# Patient Record
Sex: Female | Born: 1935 | Race: White | Hispanic: No | Marital: Married | Smoking: Never smoker
Health system: Southern US, Community
[De-identification: ages and names within clinical notes are randomized; demographics above are authoritative.]

## PROBLEM LIST (undated history)

## (undated) DIAGNOSIS — I739 Peripheral vascular disease, unspecified: Secondary | ICD-10-CM

## (undated) DIAGNOSIS — I639 Cerebral infarction, unspecified: Secondary | ICD-10-CM

## (undated) DIAGNOSIS — I10 Essential (primary) hypertension: Secondary | ICD-10-CM

## (undated) DIAGNOSIS — C4490 Unspecified malignant neoplasm of skin, unspecified: Secondary | ICD-10-CM

## (undated) DIAGNOSIS — R55 Syncope and collapse: Secondary | ICD-10-CM

## (undated) DIAGNOSIS — E079 Disorder of thyroid, unspecified: Secondary | ICD-10-CM

## (undated) DIAGNOSIS — F0390 Unspecified dementia without behavioral disturbance: Secondary | ICD-10-CM

## (undated) DIAGNOSIS — F419 Anxiety disorder, unspecified: Secondary | ICD-10-CM

## (undated) DIAGNOSIS — R569 Unspecified convulsions: Secondary | ICD-10-CM

## (undated) DIAGNOSIS — M1990 Unspecified osteoarthritis, unspecified site: Secondary | ICD-10-CM

## (undated) DIAGNOSIS — K449 Diaphragmatic hernia without obstruction or gangrene: Secondary | ICD-10-CM

---

## 2004-05-30 ENCOUNTER — Ambulatory Visit: Payer: Self-pay | Admitting: Family Medicine

## 2006-07-21 ENCOUNTER — Ambulatory Visit: Payer: Self-pay | Admitting: Family Medicine

## 2009-01-11 ENCOUNTER — Ambulatory Visit (HOSPITAL_COMMUNITY): Admission: RE | Admit: 2009-01-11 | Discharge: 2009-01-11 | Payer: Self-pay | Admitting: Ophthalmology

## 2009-02-01 ENCOUNTER — Ambulatory Visit (HOSPITAL_COMMUNITY): Admission: RE | Admit: 2009-02-01 | Discharge: 2009-02-01 | Payer: Self-pay | Admitting: Ophthalmology

## 2009-02-20 ENCOUNTER — Ambulatory Visit (HOSPITAL_COMMUNITY): Admission: RE | Admit: 2009-02-20 | Discharge: 2009-02-20 | Payer: Self-pay | Admitting: Family Medicine

## 2010-01-21 ENCOUNTER — Emergency Department (HOSPITAL_COMMUNITY): Admission: EM | Admit: 2010-01-21 | Discharge: 2010-01-21 | Payer: Self-pay | Admitting: Emergency Medicine

## 2010-08-09 LAB — BASIC METABOLIC PANEL
BUN: 11 mg/dL (ref 6–23)
CO2: 28 mEq/L (ref 19–32)
Calcium: 9.7 mg/dL (ref 8.4–10.5)
Creatinine, Ser: 0.8 mg/dL (ref 0.4–1.2)
GFR calc Af Amer: >60 mL/min (ref >60)
Potassium: 4.2 mEq/L (ref 3.5–5.1)

## 2010-08-09 LAB — HEMOGLOBIN AND HEMATOCRIT, BLOOD: Hemoglobin: 10.8 g/dL — ABNORMAL LOW (ref 12.0–15.0)

## 2014-03-29 ENCOUNTER — Other Ambulatory Visit (HOSPITAL_COMMUNITY): Payer: Self-pay | Admitting: Family Medicine

## 2014-03-29 DIAGNOSIS — R140 Abdominal distension (gaseous): Secondary | ICD-10-CM

## 2014-04-03 ENCOUNTER — Ambulatory Visit (HOSPITAL_COMMUNITY)
Admission: RE | Admit: 2014-04-03 | Discharge: 2014-04-03 | Disposition: A | Discharge status: Home / Self Care | Payer: Medicare Other | Source: Ambulatory Visit | Attending: Family Medicine | Admitting: Family Medicine

## 2014-04-03 DIAGNOSIS — R140 Abdominal distension (gaseous): Secondary | ICD-10-CM | POA: Diagnosis not present

## 2014-06-01 ENCOUNTER — Emergency Department (HOSPITAL_COMMUNITY): Payer: Medicare Other

## 2014-06-01 ENCOUNTER — Observation Stay (HOSPITAL_COMMUNITY): Payer: Medicare Other

## 2014-06-01 ENCOUNTER — Observation Stay (HOSPITAL_COMMUNITY)
Admission: EM | Admit: 2014-06-01 | Discharge: 2014-06-03 | Disposition: A | Discharge status: Home / Self Care | Payer: Medicare Other | Attending: Internal Medicine | Admitting: Internal Medicine

## 2014-06-01 ENCOUNTER — Encounter (HOSPITAL_COMMUNITY): Payer: Self-pay

## 2014-06-01 DIAGNOSIS — E039 Hypothyroidism, unspecified: Secondary | ICD-10-CM | POA: Diagnosis present

## 2014-06-01 DIAGNOSIS — I739 Peripheral vascular disease, unspecified: Secondary | ICD-10-CM | POA: Insufficient documentation

## 2014-06-01 DIAGNOSIS — R7989 Other specified abnormal findings of blood chemistry: Secondary | ICD-10-CM | POA: Insufficient documentation

## 2014-06-01 DIAGNOSIS — Z79899 Other long term (current) drug therapy: Secondary | ICD-10-CM | POA: Diagnosis not present

## 2014-06-01 DIAGNOSIS — F0390 Unspecified dementia without behavioral disturbance: Secondary | ICD-10-CM | POA: Diagnosis not present

## 2014-06-01 DIAGNOSIS — E079 Disorder of thyroid, unspecified: Secondary | ICD-10-CM | POA: Diagnosis not present

## 2014-06-01 DIAGNOSIS — R778 Other specified abnormalities of plasma proteins: Secondary | ICD-10-CM

## 2014-06-01 DIAGNOSIS — E785 Hyperlipidemia, unspecified: Secondary | ICD-10-CM | POA: Diagnosis present

## 2014-06-01 DIAGNOSIS — R140 Abdominal distension (gaseous): Secondary | ICD-10-CM

## 2014-06-01 DIAGNOSIS — R197 Diarrhea, unspecified: Secondary | ICD-10-CM | POA: Diagnosis not present

## 2014-06-01 DIAGNOSIS — R55 Syncope and collapse: Secondary | ICD-10-CM | POA: Diagnosis not present

## 2014-06-01 DIAGNOSIS — F419 Anxiety disorder, unspecified: Secondary | ICD-10-CM | POA: Insufficient documentation

## 2014-06-01 DIAGNOSIS — M1990 Unspecified osteoarthritis, unspecified site: Secondary | ICD-10-CM | POA: Diagnosis not present

## 2014-06-01 DIAGNOSIS — I10 Essential (primary) hypertension: Secondary | ICD-10-CM | POA: Diagnosis present

## 2014-06-01 DIAGNOSIS — F0391 Unspecified dementia with behavioral disturbance: Secondary | ICD-10-CM

## 2014-06-01 DIAGNOSIS — I639 Cerebral infarction, unspecified: Secondary | ICD-10-CM

## 2014-06-01 DIAGNOSIS — Z8673 Personal history of transient ischemic attack (TIA), and cerebral infarction without residual deficits: Secondary | ICD-10-CM | POA: Diagnosis present

## 2014-06-01 HISTORY — DX: Anxiety disorder, unspecified: F41.9

## 2014-06-01 HISTORY — DX: Peripheral vascular disease, unspecified: I73.9

## 2014-06-01 HISTORY — DX: Unspecified osteoarthritis, unspecified site: M19.90

## 2014-06-01 HISTORY — DX: Disorder of thyroid, unspecified: E07.9

## 2014-06-01 HISTORY — DX: Unspecified dementia, unspecified severity, without behavioral disturbance, psychotic disturbance, mood disturbance, and anxiety: F03.90

## 2014-06-01 LAB — COMPREHENSIVE METABOLIC PANEL
ALT: 13 U/L (ref 0–35)
AST: 23 U/L (ref 0–37)
Albumin: 3.6 g/dL (ref 3.5–5.2)
Alkaline Phosphatase: 64 U/L (ref 39–117)
Anion gap: 6 (ref 5–15)
BILIRUBIN TOTAL: 0.4 mg/dL (ref 0.3–1.2)
BUN: 14 mg/dL (ref 6–23)
CALCIUM: 9 mg/dL (ref 8.4–10.5)
CHLORIDE: 97 mmol/L (ref 96–112)
CO2: 31 mmol/L (ref 19–32)
CREATININE: 0.86 mg/dL (ref 0.50–1.10)
GFR calc non Af Amer: 60 mL/min — ABNORMAL LOW (ref >90)
GFR, EST AFRICAN AMERICAN: 70 mL/min — AB (ref >90)
Glucose, Bld: 121 mg/dL — ABNORMAL HIGH (ref 70–99)
Potassium: 3.4 mmol/L — ABNORMAL LOW (ref 3.5–5.1)
SODIUM: 134 mmol/L — AB (ref 135–145)
TOTAL PROTEIN: 7.5 g/dL (ref 6.0–8.3)

## 2014-06-01 LAB — CBC WITH DIFFERENTIAL/PLATELET
BASOS ABS: 0 10*3/uL (ref 0.0–0.1)
BASOS PCT: 0 % (ref 0–1)
Eosinophils Absolute: 0.3 10*3/uL (ref 0.0–0.7)
Eosinophils Relative: 2 % (ref 0–5)
HEMATOCRIT: 34.2 % — AB (ref 36.0–46.0)
Hemoglobin: 11.3 g/dL — ABNORMAL LOW (ref 12.0–15.0)
Lymphocytes Relative: 16 % (ref 12–46)
Lymphs Abs: 1.8 10*3/uL (ref 0.7–4.0)
MCH: 30.8 pg (ref 26.0–34.0)
MCHC: 33 g/dL (ref 30.0–36.0)
MCV: 93.2 fL (ref 78.0–100.0)
MONO ABS: 1.2 10*3/uL — AB (ref 0.1–1.0)
MONOS PCT: 10 % (ref 3–12)
Neutro Abs: 8.2 10*3/uL — ABNORMAL HIGH (ref 1.7–7.7)
Neutrophils Relative %: 72 % (ref 43–77)
PLATELETS: 237 10*3/uL (ref 150–400)
RBC: 3.67 MIL/uL — ABNORMAL LOW (ref 3.87–5.11)
RDW: 14 % (ref 11.5–15.5)
WBC: 11.5 10*3/uL — ABNORMAL HIGH (ref 4.0–10.5)

## 2014-06-01 LAB — URINALYSIS, ROUTINE W REFLEX MICROSCOPIC
BILIRUBIN URINE: NEGATIVE
GLUCOSE, UA: NEGATIVE mg/dL
Hgb urine dipstick: NEGATIVE
Ketones, ur: NEGATIVE mg/dL
LEUKOCYTES UA: NEGATIVE
Nitrite: NEGATIVE
PROTEIN: NEGATIVE mg/dL
SPECIFIC GRAVITY, URINE: 1.01 (ref 1.005–1.030)
UROBILINOGEN UA: 0.2 mg/dL (ref 0.0–1.0)
pH: 6 (ref 5.0–8.0)

## 2014-06-01 LAB — TROPONIN I
Troponin I: 0.04 ng/mL — ABNORMAL HIGH (ref <0.031)
Troponin I: 0.03 ng/mL (ref <0.031)
Troponin I: 0.04 ng/mL — ABNORMAL HIGH (ref <0.031)

## 2014-06-01 LAB — LIPASE, BLOOD: Lipase: 35 U/L (ref 11–59)

## 2014-06-01 MED ORDER — MEMANTINE HCL 10 MG PO TABS
10.0000 mg | ORAL_TABLET | Freq: Two times a day (BID) | ORAL | Status: DC
  Administered 2014-06-01 – 2014-06-03 (×4): 10 mg via ORAL
  Filled 2014-06-01 (×4): qty 1

## 2014-06-01 MED ORDER — SODIUM CHLORIDE 0.9 % IJ SOLN
3.0000 mL | Freq: Two times a day (BID) | INTRAMUSCULAR | Status: DC
  Administered 2014-06-02: 3 mL via INTRAVENOUS

## 2014-06-01 MED ORDER — PRAVASTATIN SODIUM 40 MG PO TABS
40.0000 mg | ORAL_TABLET | Freq: Every day | ORAL | Status: DC
  Administered 2014-06-01 – 2014-06-02 (×2): 40 mg via ORAL
  Filled 2014-06-01 (×2): qty 1

## 2014-06-01 MED ORDER — LEVOTHYROXINE SODIUM 50 MCG PO TABS: 50.0000 ug | ORAL_TABLET | Freq: Every day | ORAL | Status: DC

## 2014-06-01 MED ORDER — ASPIRIN 81 MG PO CHEW
81.0000 mg | CHEWABLE_TABLET | Freq: Every day | ORAL | Status: DC
  Administered 2014-06-02: 81 mg via ORAL
  Filled 2014-06-01: qty 1

## 2014-06-01 MED ORDER — SODIUM CHLORIDE 0.9 % IV SOLN
INTRAVENOUS | Status: DC
  Administered 2014-06-01 – 2014-06-02 (×2): via INTRAVENOUS

## 2014-06-01 MED ORDER — LORAZEPAM 0.5 MG PO TABS
0.5000 mg | ORAL_TABLET | Freq: Two times a day (BID) | ORAL | Status: DC
  Administered 2014-06-01 – 2014-06-03 (×4): 0.5 mg via ORAL
  Filled 2014-06-01 (×4): qty 1

## 2014-06-01 MED ORDER — LEVOTHYROXINE SODIUM 50 MCG PO TABS
50.0000 ug | ORAL_TABLET | Freq: Every day | ORAL | Status: DC
  Administered 2014-06-02 – 2014-06-03 (×2): 50 ug via ORAL
  Filled 2014-06-01 (×2): qty 1

## 2014-06-01 MED ORDER — ONDANSETRON HCL 4 MG/2ML IJ SOLN: 4.0000 mg | Freq: Four times a day (QID) | INTRAMUSCULAR | Status: DC | PRN

## 2014-06-01 MED ORDER — VITAMIN B-12 1000 MCG PO TABS
1000.0000 ug | ORAL_TABLET | Freq: Every day | ORAL | Status: DC
  Administered 2014-06-02 – 2014-06-03 (×2): 1000 ug via ORAL
  Filled 2014-06-01 (×2): qty 1

## 2014-06-01 MED ORDER — POTASSIUM CHLORIDE CRYS ER 20 MEQ PO TBCR
20.0000 meq | EXTENDED_RELEASE_TABLET | Freq: Every day | ORAL | Status: DC
  Administered 2014-06-01 – 2014-06-02 (×2): 20 meq via ORAL
  Filled 2014-06-01 (×2): qty 1

## 2014-06-01 MED ORDER — MELATONIN 10 MG PO TABS: 10.0000 mg | ORAL_TABLET | Freq: Every day | ORAL | Status: DC

## 2014-06-01 MED ORDER — LEVOTHYROXINE SODIUM 50 MCG PO TABS: 50000.0000 ug | ORAL_TABLET | Freq: Every day | ORAL | Status: DC

## 2014-06-01 MED ORDER — SIMETHICONE 80 MG PO CHEW: 80.0000 mg | CHEWABLE_TABLET | Freq: Four times a day (QID) | ORAL | Status: DC | PRN

## 2014-06-01 MED ORDER — ONDANSETRON HCL 4 MG PO TABS: 4.0000 mg | ORAL_TABLET | Freq: Four times a day (QID) | ORAL | Status: DC | PRN

## 2014-06-01 MED ORDER — DONEPEZIL HCL 5 MG PO TABS
5.0000 mg | ORAL_TABLET | Freq: Every day | ORAL | Status: DC
  Administered 2014-06-01 – 2014-06-02 (×2): 5 mg via ORAL
  Filled 2014-06-01 (×2): qty 1

## 2014-06-01 MED ORDER — HYDROCHLOROTHIAZIDE 25 MG PO TABS
25.0000 mg | ORAL_TABLET | Freq: Every day | ORAL | Status: DC
  Administered 2014-06-02: 25 mg via ORAL
  Filled 2014-06-01: qty 1

## 2014-06-01 MED ORDER — HEPARIN SODIUM (PORCINE) 5000 UNIT/ML IJ SOLN
5000.0000 [IU] | Freq: Three times a day (TID) | INTRAMUSCULAR | Status: DC
  Administered 2014-06-01 – 2014-06-03 (×6): 5000 [IU] via SUBCUTANEOUS
  Filled 2014-06-01 (×6): qty 1

## 2014-06-01 NOTE — ED Notes (Signed)
Pt c/o nausea, luq pain, lower back pain, diarrhea, and generalized weakness since this morning.  EMS reports cbg was 122.

## 2014-06-01 NOTE — ED Provider Notes (Signed)
CSN: [id number]     Arrival date & time 06/01/14  1258 History   First MD Initiated Contact with Patient 06/01/14 1319     Chief Complaint  Patient presents with  . Loss of Consciousness  . Diarrhea      Patient is a 81 y.o. female presenting with syncope and diarrhea. The history is provided by a caregiver, a relative and the patient. The history is limited by the condition of the patient (Hx dementia).  Loss of Consciousness Diarrhea Pt was seen at 1330.  Per EMS, pt's family and pt: Family states pt has had several "loose stools" since yesterday. Went into bathroom today and "was taking a while," so family went to check on her. Pt was found "unresponsive," "slumped over" and "drooling." Family states pt was "passed out" for several minutes and was more confused than her baseline when she spontaneously awoke. No reported seizure activity, no incont of bowel/bladder, no black or blood in stools, no N/V, no fevers. Pt herself has significant hx of dementia and currently denies any complaints. Family is also concerned regarding pt's "left hand grip and arm strength is weaker than her right since Thanksgiving." Family has not taken pt to her PMD for evaluation of this complaint.     Past Medical History  Diagnosis Date  . Thyroid disease   . Dementia   . Anxiety    History reviewed. No pertinent past surgical history.  History  Substance Use Topics  . Smoking status: Never Smoker   . Smokeless tobacco: Not on file  . Alcohol Use: No    Review of Systems  Unable to perform ROS: Dementia  Cardiovascular: Positive for syncope.  Gastrointestinal: Positive for diarrhea.      Allergies  Review of patient's allergies indicates no known allergies.  Home Medications   Prior to Admission medications   Not on File   BP 147/59 mmHg  Pulse 66  Temp(Src) 97.7 F (36.5 C) (Oral)  Resp 16  Ht 5\' 4"  (1.626 m)  Wt 128 lb (58.06 kg)  BMI 21.96 kg/m2  SpO2 98% Physical  Exam 1335: Physical examination:  Nursing notes reviewed; Vital signs and O2 SAT reviewed;  Constitutional: Well developed, Well nourished, In no acute distress; Head:  Normocephalic, atraumatic; Eyes: EOMI, PERRL, No scleral icterus; ENMT: Mouth and pharynx normal, Mucous membranes dry; Neck: Supple, Full range of motion, No lymphadenopathy; Cardiovascular: Regular rate and rhythm, No gallop; Respiratory: Breath sounds clear & equal bilaterally, No wheezes.  Speaking full sentences with ease, Normal respiratory effort/excursion; Chest: Nontender, Movement normal; Abdomen: Soft, Nontender, Nondistended, Normal bowel sounds; Genitourinary: No CVA tenderness; Extremities: Pulses normal, No tenderness, No edema, No calf edema or asymmetry.; Neuro: Awake, alert, confused re: events per hx dementia. Major CN grossly intact. No facial droop. Speech clear. Grips equal. Strength 5/5 equal bilat UE's and LE's. Moves all extremities on stretcher spontaneously and to command without apparent gross focal motor deficits.; Skin: Color normal, Warm, Dry.   ED Course  Procedures     EKG Interpretation None      MDM  MDM Reviewed: previous chart, nursing note and vitals Reviewed previous: labs Interpretation: labs, ECG, x-ray and CT scan     Date: 06/01/2014  Rate: 60  Rhythm: normal sinus rhythm  QRS Axis: normal  Intervals: PR prolonged  ST/T Wave abnormalities: nonspecific ST/T changes, ST depression inferior and lateral leads, no ST elevations  Conduction Disutrbances:first-degree A-V block   Narrative Interpretation:   Old EKG  Reviewed: none available.  Results for orders placed or performed during the hospital encounter of 06/01/14  CBC with Differential  Result Value Ref Range   WBC 11.5 (H) 4.0 - 10.5 K/uL   RBC 3.67 (L) 3.87 - 5.11 MIL/uL   Hemoglobin 11.3 (L) 12.0 - 15.0 g/dL   HCT 34.2 (L) 36.0 - 46.0 %   MCV 93.2 78.0 - 100.0 fL   MCH 30.8 26.0 - 34.0 pg   MCHC 33.0 30.0 - 36.0  g/dL   RDW 14.0 11.5 - 15.5 %   Platelets 237 150 - 400 K/uL   Neutrophils Relative % 72 43 - 77 %   Neutro Abs 8.2 (H) 1.7 - 7.7 K/uL   Lymphocytes Relative 16 12 - 46 %   Lymphs Abs 1.8 0.7 - 4.0 K/uL   Monocytes Relative 10 3 - 12 %   Monocytes Absolute 1.2 (H) 0.1 - 1.0 K/uL   Eosinophils Relative 2 0 - 5 %   Eosinophils Absolute 0.3 0.0 - 0.7 K/uL   Basophils Relative 0 0 - 1 %   Basophils Absolute 0.0 0.0 - 0.1 K/uL  Comprehensive metabolic panel  Result Value Ref Range   Sodium 134 (L) 135 - 145 mmol/L   Potassium 3.4 (L) 3.5 - 5.1 mmol/L   Chloride 97 96 - 112 mmol/L   CO2 31 19 - 32 mmol/L   Glucose, Bld 121 (H) 70 - 99 mg/dL   BUN 14 6 - 23 mg/dL   Creatinine, Ser 0.86 0.50 - 1.10 mg/dL   Calcium 9.0 8.4 - 10.5 mg/dL   Total Protein 7.5 6.0 - 8.3 g/dL   Albumin 3.6 3.5 - 5.2 g/dL   AST 23 0 - 37 U/L   ALT 13 0 - 35 U/L   Alkaline Phosphatase 64 39 - 117 U/L   Total Bilirubin 0.4 0.3 - 1.2 mg/dL   GFR calc non Af Amer 60 (L) >90 mL/min   GFR calc Af Amer 70 (L) >90 mL/min   Anion gap 6 5 - 15  Lipase, blood  Result Value Ref Range   Lipase 35 11 - 59 U/L  Troponin I  Result Value Ref Range   Troponin I 0.04 (H) <0.031 ng/mL   Dg Chest 2 View 06/01/2014   CLINICAL DATA:  Left upper quadrant abdominal pain.  Low back pain.  EXAM: CHEST  2 VIEW  COMPARISON:  Single view of the chest 03/28/2011.  FINDINGS: Mild left basilar atelectasis is noted. The lungs are otherwise clear. Heart size is upper normal. The patient has a small hiatal hernia. No pneumothorax or pleural effusion. Thoracolumbar scoliosis is noted.  IMPRESSION: No acute disease.  Small hiatal hernia.   Electronically Signed   By: Inge Rise M.D.   On: 06/01/2014 14:38   Ct Head Wo Contrast 06/01/2014   CLINICAL DATA:  81 year old female with syncope and generalized weakness  EXAM: CT HEAD WITHOUT CONTRAST  TECHNIQUE: Contiguous axial images were obtained from the base of the skull through the vertex  without intravenous contrast.  COMPARISON:  None.  FINDINGS: Negative for acute intracranial hemorrhage, acute infarction, mass, mass effect, hydrocephalus or midline shift. Gray-white differentiation is preserved throughout. Mild ventriculomegaly likely secondary to central atrophy. Focal hypoattenuation in the right caudate head and PET min consistent with remote lacunar infarcts. Visualize globes and orbits are symmetric bilaterally. No focal soft tissue or calvarial abnormality. Incidental note is made of bilateral concha bullosa. Normal aeration of the mastoid air  cells and visualized paranasal sinuses. Atherosclerotic calcifications present in the bilateral cavernous carotid arteries.  IMPRESSION: 1. No acute intracranial abnormality. 2. Ventriculomegaly is likely secondary to central atrophy. However, in the appropriate clinical setting normal pressure hydrocephalus would be difficult to exclude radiographically. 3. Remote lacunar infarcts in the right basal ganglia. 4. Atherosclerotic calcifications in the bilateral cavernous carotid arteries.   Electronically Signed   By: Jacqulynn Cadet M.D.   On: 06/01/2014 15:49    1525:  Troponin mildly elevated, but EKG without ST elevations and pt denies CP. Pt is not orthostatic. Dx and testing d/w pt and family.  Questions answered.  Verb understanding, agreeable to admit.   T/C to [facility]. Anastasio Champion, case discussed, including:  HPI, pertinent PM/SHx, VS/PE, dx testing, ED course and treatment:  Agreeable to admit, requests to write temporary orders, obtain tele bed to team APAdmits.    Francine Graven, DO 06/05/14 1233

## 2014-06-01 NOTE — ED Notes (Signed)
Daughters in room.  And states pt had episode earlier today while sitting on toilet where she was unresponsive.  States she has had several of these episodes in the past.  And has been admitted to [facility] hospital with clean workup.  Also states pt has had some diarrhea and abdominal pain since yesterday.  Pt points to LUQ when asked where she hurts.

## 2014-06-01 NOTE — H&P (Signed)
[facility] History and Physical  Patricia Shepherd TIR:[id number] DOB: 1928-02-26 DOA: 06/01/2014  Referring physician: ER PCP: Patricia Mustache, MD   Chief Complaint: Syncope  HPI: Patricia Shepherd is a 81 y.o. female  This is an 81 year old lady who has mild-to-moderate dementia and who had an episode of unresponsiveness today whilst she was in the bathroom. She apparently was unresponsive for several minutes, maybe as long as 10 minutes. No seizure activity was witnessed. She appears now to be back to her baseline. On closer questioning the patient's daughters who are at the bedside, it appears that this is the fourth episode like this that she has had in the last 2 years. The family is also concerned about possible weakness in the left hand and arm since Thanksgiving of last year. The patient also has had some loose stools in the last 24 hours. She denies any chest pain, palpitations, dyspnea, cough or fever. History is somewhat limited secondary to her dementia.   Review of Systems:  Apart from symptoms above, all other systems negative.  Past Medical History  Diagnosis Date  . Thyroid disease   . Dementia   . Anxiety    History reviewed. No pertinent past surgical history. Social History:  reports that she has never smoked. She does not have any smokeless tobacco history on file. She reports that she does not drink alcohol or use illicit drugs.  No Known Allergies  Family history: there does appear to be a history of cerebrovascular disease in the family with several extended family members having had a stroke or TIA.  Prior to Admission medications   Medication Sig Start Date End Date Taking? Authorizing Provider  acetaminophen (TYLENOL) 500 MG tablet Take 500-1,000 mg by mouth at bedtime. Takes 2 tablets in the morning, 1 tablet at lunch and 2 tablets at bedtime   Yes Historical Provider, MD  aspirin 81 MG chewable tablet Chew 81 mg by mouth daily.   Yes Historical  Provider, MD  cetirizine (ZYRTEC) 10 MG tablet Take 5 mg by mouth at bedtime.   Yes Historical Provider, MD  donepezil (ARICEPT) 10 MG tablet Take 5 mg by mouth at bedtime. 04/10/14  Yes Historical Provider, MD  hydrochlorothiazide (HYDRODIURIL) 25 MG tablet Take 25 mg by mouth daily. 11/15/13 11/15/14 Yes Historical Provider, MD  LORazepam (ATIVAN) 0.5 MG tablet 0.5 mg 2 (two) times daily.  05/30/14  Yes Historical Provider, MD  Melatonin 10 MG TABS Take 10 mg by mouth at bedtime.   Yes Historical Provider, MD  memantine (NAMENDA) 10 MG tablet Take 10 mg by mouth 2 (two) times daily. 01/23/14  Yes Historical Provider, MD  potassium chloride (K-DUR,KLOR-CON) 10 MEQ tablet Take 20 mEq by mouth at bedtime. Dissolve in water then mix in applesauce 04/11/14  Yes Historical Provider, MD  pravastatin (PRAVACHOL) 40 MG tablet Take 40 mg by mouth at bedtime. 05/06/14  Yes Historical Provider, MD  simethicone (MYLICON) 676 MG chewable tablet Chew 125 mg by mouth at bedtime.   Yes Historical Provider, MD  SYNTHROID 50 MCG tablet Take 50 mg by mouth daily. 05/23/14  Yes Historical Provider, MD  vitamin B-12 (CYANOCOBALAMIN) 1000 MCG tablet Take 1,000 mcg by mouth daily.   Yes Historical Provider, MD   Physical Exam: Filed Vitals:   06/01/14 1415 06/01/14 1430 06/01/14 1500 06/01/14 1600  BP:  126/53 127/54 151/64  Pulse: 66 64 65 65  Temp:      TempSrc:      Resp:  15 16 16 19   Height:      Weight:      SpO2: 97% 94% 93% 97%    Wt Readings from Last 3 Encounters:  06/01/14 58.06 kg (128 lb)    General:  Appears calm and comfortable. She is not clinically dehydrated. She appears to be hemodynamically stable. Eyes: PERRL, normal lids, irises & conjunctiva ENT: grossly normal hearing, lips & tongue Neck: no LAD, masses or thyromegaly Cardiovascular: RRR, no m/r/g. No LE edema. Telemetry: SR, no arrhythmias  Respiratory: CTA bilaterally, no w/r/r. Normal respiratory effort. Abdomen: soft, ntnd Skin: no  rash or induration seen on limited exam Musculoskeletal: grossly normal tone BUE/BLE Psychiatric: grossly normal mood and affect, speech fluent and appropriate Neurologic: grossly non-focal.          Labs on Admission:  Basic Metabolic Panel:  Recent Labs Lab 06/01/14 1316  NA 134*  K 3.4*  CL 97  CO2 31  GLUCOSE 121*  BUN 14  CREATININE 0.86  CALCIUM 9.0   Liver Function Tests:  Recent Labs Lab 06/01/14 1316  AST 23  ALT 13  ALKPHOS 64  BILITOT 0.4  PROT 7.5  ALBUMIN 3.6    Recent Labs Lab 06/01/14 1316  LIPASE 35   No results for input(s): AMMONIA in the last 168 hours. CBC:  Recent Labs Lab 06/01/14 1316  WBC 11.5*  NEUTROABS 8.2*  HGB 11.3*  HCT 34.2*  MCV 93.2  PLT 237   Cardiac Enzymes:  Recent Labs Lab 06/01/14 1316  TROPONINI 0.04*    BNP (last 3 results) No results for input(s): PROBNP in the last 8760 hours. CBG: No results for input(s): GLUCAP in the last 168 hours.  Radiological Exams on Admission: Dg Chest 2 View  06/01/2014   CLINICAL DATA:  Left upper quadrant abdominal pain.  Low back pain.  EXAM: CHEST  2 VIEW  COMPARISON:  Single view of the chest 03/28/2011.  FINDINGS: Mild left basilar atelectasis is noted. The lungs are otherwise clear. Heart size is upper normal. The patient has a small hiatal hernia. No pneumothorax or pleural effusion. Thoracolumbar scoliosis is noted.  IMPRESSION: No acute disease.  Small hiatal hernia.   Electronically Signed   By: Inge Rise M.D.   On: 06/01/2014 14:38   Ct Head Wo Contrast  06/01/2014   CLINICAL DATA:  81 year old female with syncope and generalized weakness  EXAM: CT HEAD WITHOUT CONTRAST  TECHNIQUE: Contiguous axial images were obtained from the base of the skull through the vertex without intravenous contrast.  COMPARISON:  None.  FINDINGS: Negative for acute intracranial hemorrhage, acute infarction, mass, mass effect, hydrocephalus or midline shift. Gray-white  differentiation is preserved throughout. Mild ventriculomegaly likely secondary to central atrophy. Focal hypoattenuation in the right caudate head and PET min consistent with remote lacunar infarcts. Visualize globes and orbits are symmetric bilaterally. No focal soft tissue or calvarial abnormality. Incidental note is made of bilateral concha bullosa. Normal aeration of the mastoid air cells and visualized paranasal sinuses. Atherosclerotic calcifications present in the bilateral cavernous carotid arteries.  IMPRESSION: 1. No acute intracranial abnormality. 2. Ventriculomegaly is likely secondary to central atrophy. However, in the appropriate clinical setting normal pressure hydrocephalus would be difficult to exclude radiographically. 3. Remote lacunar infarcts in the right basal ganglia. 4. Atherosclerotic calcifications in the bilateral cavernous carotid arteries.   Electronically Signed   By: Jacqulynn Cadet M.D.   On: 06/01/2014 15:49    Assessment/Plan   1. Syncope. I suspect  this is a vasovagal event. The other 3 episodes that occurred over the last 2 years have always been on the toilet. Her troponin is slightly elevated. The family are concerned about possible TIA or stroke. She will be admitted to telemetry. She will get serial cardiac enzymes. I will order MRI brain scan to see if she has had a cerebrovascular event.  Further recommendations will depend on patient's hospital progress.   Code Status: Full code. I did discuss CODE STATUS with both her daughters and they will discuss this further amongst themselves.   DVT Prophylaxis: Heparin.  Family Communication: I discussed the plan with the patient's daughters at the bedside.   Disposition Plan: Home when medically stable, probably tomorrow.   Time spent: 60 minutes.  Doree Albee [facility] Pager [phone].

## 2014-06-01 NOTE — Progress Notes (Signed)
PHARMACIST - PHYSICIAN ORDER COMMUNICATION  CONCERNING: P&T Medication Policy on Herbal Medications  DESCRIPTION:  This patient's order for:  Melatonin  has been noted.  This product(s) is classified as an "herbal" or natural product. Due to a lack of definitive safety studies or FDA approval, nonstandard manufacturing practices, plus the potential risk of unknown drug-drug interactions while on inpatient medications, the Pharmacy and Therapeutics Committee does not permit the use of "herbal" or natural products of this type within [facility].   ACTION TAKEN: The pharmacy department is unable to verify this order at this time and your patient has been informed of this safety policy. Please reevaluate patient's clinical condition at discharge and address if the herbal or natural product(s) should be resumed at that time.  Netta Cedars, PharmD, BCPS 06/01/2014@4 :22 PM

## 2014-06-01 NOTE — ED Notes (Signed)
Alecia Lemming EMT and J.J. Cruise R.N. Performed in/out cath

## 2014-06-02 ENCOUNTER — Observation Stay (HOSPITAL_COMMUNITY): Payer: Medicare Other

## 2014-06-02 DIAGNOSIS — Z8673 Personal history of transient ischemic attack (TIA), and cerebral infarction without residual deficits: Secondary | ICD-10-CM | POA: Diagnosis present

## 2014-06-02 DIAGNOSIS — I10 Essential (primary) hypertension: Secondary | ICD-10-CM

## 2014-06-02 DIAGNOSIS — I6789 Other cerebrovascular disease: Secondary | ICD-10-CM

## 2014-06-02 DIAGNOSIS — I639 Cerebral infarction, unspecified: Secondary | ICD-10-CM

## 2014-06-02 DIAGNOSIS — F0390 Unspecified dementia without behavioral disturbance: Secondary | ICD-10-CM

## 2014-06-02 DIAGNOSIS — R7989 Other specified abnormal findings of blood chemistry: Secondary | ICD-10-CM

## 2014-06-02 DIAGNOSIS — E039 Hypothyroidism, unspecified: Secondary | ICD-10-CM

## 2014-06-02 DIAGNOSIS — R778 Other specified abnormalities of plasma proteins: Secondary | ICD-10-CM | POA: Diagnosis present

## 2014-06-02 DIAGNOSIS — E785 Hyperlipidemia, unspecified: Secondary | ICD-10-CM | POA: Diagnosis present

## 2014-06-02 LAB — LIPID PANEL
CHOL/HDL RATIO: 5.5 ratio
Cholesterol: 182 mg/dL (ref 0–200)
HDL: 33 mg/dL — ABNORMAL LOW (ref >39)
LDL Cholesterol: 88 mg/dL (ref 0–99)
Triglycerides: 303 mg/dL — ABNORMAL HIGH (ref <150)
VLDL: 61 mg/dL — AB (ref 0–40)

## 2014-06-02 LAB — COMPREHENSIVE METABOLIC PANEL
ALT: 13 U/L (ref 0–35)
AST: 22 U/L (ref 0–37)
Albumin: 3.3 g/dL — ABNORMAL LOW (ref 3.5–5.2)
Alkaline Phosphatase: 64 U/L (ref 39–117)
Anion gap: 8 (ref 5–15)
BUN: 9 mg/dL (ref 6–23)
CHLORIDE: 100 mmol/L (ref 96–112)
CO2: 30 mmol/L (ref 19–32)
Calcium: 9.1 mg/dL (ref 8.4–10.5)
Creatinine, Ser: 0.69 mg/dL (ref 0.50–1.10)
GFR calc Af Amer: >90 mL/min (ref >90)
GFR, EST NON AFRICAN AMERICAN: 78 mL/min — AB (ref >90)
Glucose, Bld: 103 mg/dL — ABNORMAL HIGH (ref 70–99)
Potassium: 3.4 mmol/L — ABNORMAL LOW (ref 3.5–5.1)
SODIUM: 138 mmol/L (ref 135–145)
TOTAL PROTEIN: 7 g/dL (ref 6.0–8.3)
Total Bilirubin: 0.6 mg/dL (ref 0.3–1.2)

## 2014-06-02 LAB — CBC
HEMATOCRIT: 35.3 % — AB (ref 36.0–46.0)
Hemoglobin: 11.5 g/dL — ABNORMAL LOW (ref 12.0–15.0)
MCH: 30.3 pg (ref 26.0–34.0)
MCHC: 32.6 g/dL (ref 30.0–36.0)
MCV: 93.1 fL (ref 78.0–100.0)
Platelets: 245 10*3/uL (ref 150–400)
RBC: 3.79 MIL/uL — AB (ref 3.87–5.11)
RDW: 14.3 % (ref 11.5–15.5)
WBC: 9.1 10*3/uL (ref 4.0–10.5)

## 2014-06-02 LAB — TROPONIN I: Troponin I: 0.04 ng/mL — ABNORMAL HIGH (ref <0.031)

## 2014-06-02 LAB — TSH: TSH: 3.721 u[IU]/mL (ref 0.350–4.500)

## 2014-06-02 MED ORDER — POTASSIUM CHLORIDE CRYS ER 20 MEQ PO TBCR
40.0000 meq | EXTENDED_RELEASE_TABLET | Freq: Once | ORAL | Status: AC
  Administered 2014-06-02: 40 meq via ORAL
  Filled 2014-06-02: qty 2

## 2014-06-02 MED ORDER — ASPIRIN 325 MG PO TABS
325.0000 mg | ORAL_TABLET | Freq: Every day | ORAL | Status: DC
  Administered 2014-06-02 – 2014-06-03 (×2): 325 mg via ORAL
  Filled 2014-06-02 (×2): qty 1

## 2014-06-02 MED ORDER — ZOLPIDEM TARTRATE 5 MG PO TABS
2.5000 mg | ORAL_TABLET | Freq: Once | ORAL | Status: AC
  Administered 2014-06-02: 2.5 mg via ORAL
  Filled 2014-06-02: qty 1

## 2014-06-02 MED ORDER — ASPIRIN 325 MG PO TABS: 325.0000 mg | ORAL_TABLET | Freq: Every day | ORAL | Status: DC

## 2014-06-02 MED ORDER — STROKE: EARLY STAGES OF RECOVERY BOOK
Freq: Once | Status: AC
  Administered 2014-06-02: 10:00:00
  Filled 2014-06-02: qty 1

## 2014-06-02 NOTE — Discharge Summary (Signed)
Physician Discharge Summary  Patricia Shepherd JXB:[id number] DOB: August 30, 1927 DOA: 06/01/2014  PCP: Sherrie Mustache, MD  Admit date: 06/01/2014 Discharge date: 06/03/2014  Time spent: 40 minutes  Recommendations for Outpatient Follow-up:  1. PCP 1 week for evaluation of symptoms.  2. HCTZ held due to dehydration and syncope 3. Consider surveillance carotid dopplers for R ICA disease in 6-12 months   Discharge Diagnoses:  Principal Problem:   Subacute CVA  Active Problems:   Syncope, likely vasovagal:    Dementia:    Essential hypertension:   Hypothyroidism:   Dehydration   Discharge Condition: stable  Diet recommendation: heart healthy  Filed Weights   06/01/14 1303 06/01/14 1824  Weight: 58.06 kg (128 lb) 70.761 kg (156 lb)    History of present illness:  This is an 81 year old lady who has mild-to-moderate dementia and who had an episode of unresponsiveness on 06/01/14 whilst she was in the bathroom presented to ED with CC syncope. She apparently was unresponsive for several minutes, maybe as long as 10 minutes. No seizure activity was witnessed. She appeared  to be back to her baseline at the time of admission exam. On closer questioning the patient's daughters who were at the bedside, it appeared that this is the fourth episode like this that she has had in the last 2 years. The family was also concerned about possible weakness in the left hand and arm since Thanksgiving of last year. The patient also  had some loose stools in the last 24 hours. She denied any chest pain, palpitations, dyspnea, cough or fever. History is somewhat limited secondary to her dementia.  Hospital Course:  CVA (cerebral infarction)   MRI with Small subacute appearing lacunar infarct in the left cerebellum. No mass effect or hemorrhage. No other acute intracranial abnormality identified, and elsewhere mild for age chronic small vessel ischemia. A1c within limits of normal. Lipid panel with triglycerides  303 and HDL 33 and VLDL 61. Carotid doppler Moderate amount of right-sided atherosclerotic plaque results in elevated peak systolic velocities within the proximal and mid aspect of the right internal carotid artery compatible with the higher end of the 50-69% luminal narrowing range. Minimal amount of left-sided atherosclerotic plaque, not resulting in a hemodynamically significant stenosis. Physical therapy evaluated the patient and she needs no follow up PT. Patient was chronically on aspirin 81mg  daily. This was increased to 325mg  daily. Continue statin.     Syncope: likely vaso vagal in setting of above. Hx of similar episodes while going to bathroom. No events on tele. Not orthostatic. Echo unremarkable.   Elevated troponin: stable at 0.04. No events on tele. No chest pain. EKG non acute. No further work up.    Dementia: stable at baseline    Essential hypertension: currently stable. Continue to monitor blood pressure since HCTZ held due to dehydration     Hypothyroidism: TSH normal, continue synthroid   Procedures:  Echo: - Left ventricle: The cavity size was normal. Wall thickness was increased in a pattern of mild LVH. Systolic function was vigorous. The estimated ejection fraction was in the range of 65% to 70%. Wall motion was normal; there were no regional wall motion abnormalities. Features are consistent with a pseudonormal left ventricular filling pattern, with concomitant abnormal relaxation and increased filling pressure (grade 2 diastolic dysfunction). - Aortic valve: Mildly calcified annulus. Trileaflet; mildly thickened leaflets. There was moderate regurgitation. The AI vena contracta is 0.4 cm. Regurgitation pressure half-time: 365 ms. - Mitral valve: Moderately to severely calcified  annulus. Mildly thickened leaflets . There was mild regurgitation. - Left atrium: The atrium was moderately dilated. - Right atrium: The atrium was mildly  dilated. - Atrial septum: No defect or patent foramen ovale was identified. - Pulmonary arteries: PA peak pressure: 33 mm Hg (S). PASP is borderline elevated. - Systemic veins: IVC is small, suggesting low RA pressure and hypovolemia. - Technically adequate study.  Consultations:  none  Discharge Exam: Filed Vitals:   06/02/14 1331  BP: 161/71  Pulse: 78  Temp: 98.1 F (36.7 C)  Resp: 18    General: well nourished nad Cardiovascular: RRR no MGR No LE edema Respiratory: normal effort BS clear Neuro: oriented to self and place. Speech clear bilateral grip 5.5  Discharge Instructions    Current Discharge Medication List    START taking these medications   Details  aspirin 325 MG tablet Take 1 tablet (325 mg total) by mouth daily.      CONTINUE these medications which have NOT CHANGED   Details  acetaminophen (TYLENOL) 500 MG tablet Take 500-1,000 mg by mouth at bedtime. Takes 2 tablets in the morning, 1 tablet at lunch and 2 tablets at bedtime    cetirizine (ZYRTEC) 10 MG tablet Take 5 mg by mouth at bedtime.    donepezil (ARICEPT) 10 MG tablet Take 5 mg by mouth at bedtime.    LORazepam (ATIVAN) 0.5 MG tablet 0.5 mg 2 (two) times daily.  Refills: 2    Melatonin 10 MG TABS Take 10 mg by mouth at bedtime.    memantine (NAMENDA) 10 MG tablet Take 10 mg by mouth 2 (two) times daily.    potassium chloride (K-DUR,KLOR-CON) 10 MEQ tablet Take 20 mEq by mouth at bedtime. Dissolve in water then mix in applesauce    pravastatin (PRAVACHOL) 40 MG tablet Take 40 mg by mouth at bedtime. Refills: 4    simethicone (MYLICON) 665 MG chewable tablet Chew 125 mg by mouth at bedtime.    SYNTHROID 50 MCG tablet Take 50 mg by mouth daily. Refills: 5    vitamin B-12 (CYANOCOBALAMIN) 1000 MCG tablet Take 1,000 mcg by mouth daily.      STOP taking these medications     aspirin 81 MG chewable tablet      hydrochlorothiazide (HYDRODIURIL) 25 MG tablet        Allergies   Allergen Reactions  . Bee Venom Swelling   Follow-up Information    Follow up with Sherrie Mustache, MD. Schedule an appointment as soon as possible for a visit in 1 week.   Specialty:  Family Medicine   Why:  for evaluation of symptoms   Contact information:   [place] [id number] [phone]        The results of significant diagnostics from this hospitalization (including imaging, microbiology, ancillary and laboratory) are listed below for reference.    Significant Diagnostic Studies: Dg Chest 2 View  06/01/2014   CLINICAL DATA:  Left upper quadrant abdominal pain.  Low back pain.  EXAM: CHEST  2 VIEW  COMPARISON:  Single view of the chest 03/28/2011.  FINDINGS: Mild left basilar atelectasis is noted. The lungs are otherwise clear. Heart size is upper normal. The patient has a small hiatal hernia. No pneumothorax or pleural effusion. Thoracolumbar scoliosis is noted.  IMPRESSION: No acute disease.  Small hiatal hernia.   Electronically Signed   By: Inge Rise M.D.   On: 06/01/2014 14:38   Ct Head Wo Contrast  06/01/2014  CLINICAL DATA:  81 year old female with syncope and generalized weakness  EXAM: CT HEAD WITHOUT CONTRAST  TECHNIQUE: Contiguous axial images were obtained from the base of the skull through the vertex without intravenous contrast.  COMPARISON:  None.  FINDINGS: Negative for acute intracranial hemorrhage, acute infarction, mass, mass effect, hydrocephalus or midline shift. Gray-white differentiation is preserved throughout. Mild ventriculomegaly likely secondary to central atrophy. Focal hypoattenuation in the right caudate head and PET min consistent with remote lacunar infarcts. Visualize globes and orbits are symmetric bilaterally. No focal soft tissue or calvarial abnormality. Incidental note is made of bilateral concha bullosa. Normal aeration of the mastoid air cells and visualized paranasal sinuses. Atherosclerotic calcifications present  in the bilateral cavernous carotid arteries.  IMPRESSION: 1. No acute intracranial abnormality. 2. Ventriculomegaly is likely secondary to central atrophy. However, in the appropriate clinical setting normal pressure hydrocephalus would be difficult to exclude radiographically. 3. Remote lacunar infarcts in the right basal ganglia. 4. Atherosclerotic calcifications in the bilateral cavernous carotid arteries.   Electronically Signed   By: Jacqulynn Cadet M.D.   On: 06/01/2014 15:49   Mr Brain Wo Contrast  06/01/2014   CLINICAL DATA:  81 year old female with syncopal episode today. Left arm and hand weakness. Initial encounter.  EXAM: MRI HEAD WITHOUT CONTRAST  TECHNIQUE: Multiplanar, multiecho pulse sequences of the brain and surrounding structures were obtained without intravenous contrast.  COMPARISON:  Head CT without contrast 1532 hrs.  FINDINGS: Subcentimeter focus of abnormal trace diffusion in the left lower cerebellum near the tonsil (series 100 image 4). This area appears isoechoic intense on ADC map, and demonstrates mild T2 and FLAIR hyperintensity along with mild T1 hypo intensity. No associated hemorrhage or mass effect.  No other restricted diffusion. Major intracranial vascular flow voids are within normal limits; fetal type bilateral PCA origins are suspected.  No midline shift, mass effect, evidence of mass lesion, ventriculomegaly, extra-axial collection or acute intracranial hemorrhage. Cervicomedullary junction and pituitary are within normal limits. Negative visualized cervical spine. Chronic lacunar infarct of the right corona radiata tracking toward the external capsule. Otherwise mild for age scattered nonspecific cerebral white matter T2 and FLAIR hyperintensity.  Normal bone marrow signal. Postoperative changes to the globes. Trace paranasal sinus mucosal thickening. Mastoids are clear. Visible internal auditory structures appear normal. Visualized scalp soft tissues are within normal  limits.  IMPRESSION: 1. Small subacute appearing lacunar infarct in the left cerebellum. No mass effect or hemorrhage. 2. No other acute intracranial abnormality identified, and elsewhere mild for age chronic small vessel ischemia.   Electronically Signed   By: Lars Pinks M.D.   On: 06/01/2014 20:05   US Carotid Bilateral  06/02/2014   CLINICAL DATA:  Stroke.  Syncopal episode.  EXAM: BILATERAL CAROTID DUPLEX ULTRASOUND  TECHNIQUE: Pearline Cables scale imaging, color Doppler and duplex ultrasound were performed of bilateral carotid and vertebral arteries in the neck.  COMPARISON:  Brain MRI - 06/01/2014  FINDINGS: Criteria: Quantification of carotid stenosis is based on velocity parameters that correlate the residual internal carotid diameter with NASCET-based stenosis levels, using the diameter of the distal internal carotid lumen as the denominator for stenosis measurement.  The following velocity measurements were obtained:  RIGHT  ICA:  224/42 cm/sec  CCA:  82/5 cm/sec  SYSTOLIC ICA/CCA RATIO:  3.3  DIASTOLIC ICA/CCA RATIO:  4.9  ECA:  119 cm/sec  LEFT  ICA:  100/16 cm/sec  CCA:  05/39 cm/sec  SYSTOLIC ICA/CCA RATIO:  1.3  DIASTOLIC ICA/CCA RATIO:  1.5  ECA:  111 cm/sec  RIGHT CAROTID ARTERY: There is intimal thickening throughout the right common carotid artery. There is eccentric mixed echogenic the largely hypoechoic plaque within the right carotid bulb (representative images 15 and 17), extending to involve the origin and proximal aspects of the right internal carotid artery (image 27) resulting in elevated peak systolic velocities within the proximal and mid aspect of the right internal carotid artery (greatest acquired peak systolic velocity within the proximal aspect of the right internal carotid artery measures 224 cm/sec - image 29).  RIGHT VERTEBRAL ARTERY:  Antegrade flow  LEFT CAROTID ARTERY: There is a minimal amount of eccentric echogenic plaque within the distal aspects of the left common carotid artery  (51). There is a minimal amount of eccentric mixed echogenic plaque within the left carotid bulb (images 54 and 56), extending to involve the origin and proximal aspects of the left internal carotid artery (image 65), not resulting in elevated peak systolic velocities within the interrogated course of the left internal carotid artery to suggest a hemodynamically significant stenosis.  LEFT VERTEBRAL ARTERY:  Antegrade flow  IMPRESSION: 1. Moderate amount of right-sided atherosclerotic plaque results in elevated peak systolic velocities within the proximal and mid aspect of the right internal carotid artery compatible with the higher end of the 50-69% luminal narrowing range. Further evaluation with CTA could be performed as clinically indicated. 2. Minimal amount of left-sided atherosclerotic plaque, not resulting in a hemodynamically significant stenosis.   Electronically Signed   By: Sandi Mariscal M.D.   On: 06/02/2014 15:24    Microbiology: No results found for this or any previous visit (from the past 240 hour(s)).   Labs: Basic Metabolic Panel:  Recent Labs Lab 06/01/14 1316 06/02/14 0506  NA 134* 138  K 3.4* 3.4*  CL 97 100  CO2 31 30  GLUCOSE 121* 103*  BUN 14 9  CREATININE 0.86 0.69  CALCIUM 9.0 9.1   Liver Function Tests:  Recent Labs Lab 06/01/14 1316 06/02/14 0506  AST 23 22  ALT 13 13  ALKPHOS 64 64  BILITOT 0.4 0.6  PROT 7.5 7.0  ALBUMIN 3.6 3.3*    Recent Labs Lab 06/01/14 1316  LIPASE 35   No results for input(s): AMMONIA in the last 168 hours. CBC:  Recent Labs Lab 06/01/14 1316 06/02/14 0506  WBC 11.5* 9.1  NEUTROABS 8.2*  --   HGB 11.3* 11.5*  HCT 34.2* 35.3*  MCV 93.2 93.1  PLT 237 245   Cardiac Enzymes:  Recent Labs Lab 06/01/14 1316 06/01/14 1615 06/01/14 2153 06/02/14 0506  TROPONINI 0.04* 0.03 0.04* 0.04*   BNP: BNP (last 3 results) No results for input(s): PROBNP in the last 8760 hours. CBG: No results for input(s): GLUCAP in  the last 168 hours.     SignedRadene Gunning  [facility] 06/02/2014, 3:56 PM  Attending note:  Patient seen and examined. Above note reviewed and amended. Patient likely had a vasovagal syncopal reaction in the setting of dehydration and straining while using the bathroom. Similar to previous episodes. MRI showed subacute infarct which a likely be an incidental finding. She does not appear to have any deficits at this time. Aspirin was changed from 81 mg 325 mg. She does have some right internal carotid artery disease which will need further follow-up/surveillance of the next 6-12 months. She is otherwise stable for discharge.  MEMON,JEHANZEB

## 2014-06-02 NOTE — Progress Notes (Signed)
  Echocardiogram 2D Echocardiogram has been performed.  Patricia Shepherd, Patricia Shepherd 06/02/2014, 4:20 PM

## 2014-06-02 NOTE — Evaluation (Signed)
Physical Therapy Evaluation Patient Details Name: Patricia Shepherd MRN: [id number] DOB: 11/29/1927 Today's Date: 06/02/2014   History of Present Illness  This is an 81 year old lady who has mild-to-moderate dementia and who had an episode of unresponsiveness today whilst she was in the bathroom. She apparently was unresponsive for several minutes, maybe as long as 10 minutes. No seizure activity was witnessed. She appears now to be back to her baseline. On closer questioning the patient's daughters who are at the bedside, it appears that this is the fourth episode like this that she has had in the last 2 years.  Clinical Impression  Pt lives at home with excellent family support.  She ambulates independently in the home most of the time.  She was seen for an evaluation of mobility and found to be at functional baseline.  No further PT should be needed.  We will ask nursing service to ambulate her while she is here in the hospital.    Follow Up Recommendations No PT follow up    Equipment Recommendations  None recommended by PT    Recommendations for Other Services  none     Precautions / Restrictions Precautions Precautions: Fall Restrictions Weight Bearing Restrictions: No      Mobility  Bed Mobility Overal bed mobility: Independent                Transfers Overall transfer level: Modified independent Equipment used: None                Ambulation/Gait Ambulation/Gait assistance: Supervision Ambulation Distance (Feet): 150 Feet Assistive device: None Gait Pattern/deviations: Decreased dorsiflexion - right;Decreased dorsiflexion - left;Shuffle   Gait velocity interpretation: Below normal speed for age/gender    Stairs            Wheelchair Mobility    Modified Rankin (Stroke Patients Only)       Balance Overall balance assessment: No apparent balance deficits (not formally assessed)                                            Pertinent Vitals/Pain Pain Assessment: No/denies pain    Home Living Family/patient expects to be discharged to:: Private residence Living Arrangements: Children;Other relatives Available Help at Discharge: Family;Available 24 hours/day Type of Home: House Home Access: Stairs to enter Entrance Stairs-Rails: None Entrance Stairs-Number of Steps: 1 Home Layout: One level Home Equipment: Walker - 2 wheels;Bedside commode;Cane - single point;Tub bench;Grab bars - toilet;Grab bars - tub/shower;Wheelchair - manual      Prior Function Level of Independence: Needs assistance   Gait / Transfers Assistance Needed: ambulated independently, sometimes with a walker...transfers independently  ADL's / Homemaking Assistance Needed: assist with bathing        Hand Dominance        Extremity/Trunk Assessment               Lower Extremity Assessment: Overall WFL for tasks assessed      Cervical / Trunk Assessment: Kyphotic  Communication   Communication: No difficulties  Cognition Arousal/Alertness: Awake/alert Behavior During Therapy: WFL for tasks assessed/performed Overall Cognitive Status: History of cognitive impairments - at baseline                      General Comments      Exercises        Assessment/Plan  PT Assessment Patent does not need any further PT services  PT Diagnosis     PT Problem List    PT Treatment Interventions     PT Goals (Current goals can be found in the Care Plan section) Acute Rehab PT Goals PT Goal Formulation: All assessment and education complete, DC therapy    Frequency     Barriers to discharge  none      Co-evaluation               End of Session Equipment Utilized During Treatment: Gait belt Activity Tolerance: Patient tolerated treatment well Patient left: in chair;with call bell/phone within reach;with family/visitor present Nurse Communication: Mobility status    Functional Assessment Tool  Used: clinical judgement Functional Limitation: Mobility: Walking and moving around Mobility: Walking and Moving Around Current Status [phone]): 0 percent impaired, limited or restricted Mobility: Walking and Moving Around Goal Status [phone]): 0 percent impaired, limited or restricted Mobility: Walking and Moving Around Discharge Status [phone]): 0 percent impaired, limited or restricted    Time: 0912-0936 PT Time Calculation (min) (ACUTE ONLY): 24 min   Charges:   PT Evaluation $Initial PT Evaluation Tier I: 1 Procedure     PT G Codes:   PT G-Codes **NOT FOR INPATIENT CLASS** Functional Assessment Tool Used: clinical judgement Functional Limitation: Mobility: Walking and moving around Mobility: Walking and Moving Around Current Status ([id number]): 0 percent impaired, limited or restricted Mobility: Walking and Moving Around Goal Status (Y9244): 0 percent impaired, limited or restricted Mobility: Walking and Moving Around Discharge Status [phone]): 0 percent impaired, limited or restricted    Demetrios Isaacs L 06/02/2014, 10:53 AM

## 2014-06-02 NOTE — Care Management Note (Signed)
    Page 1 of 1   06/02/2014     3:04:10 PM CARE MANAGEMENT NOTE 06/02/2014  Patient:  Patricia Shepherd, Patricia Shepherd   Account Number:  [id number]  Date Initiated:  06/02/2014  Documentation initiated by:  Theophilus Kinds  Subjective/Objective Assessment:   Pt admitted from home with syncope. Pt lives with family and will return home at discharge. Pt has care around the clock. Pt has been fairly independent with ADL's. Pt has DME in the home. Pt does have dementia and requires constant directi.     Action/Plan:   PT evaluated pt and does not recommend any PT followup. No CM needs noted.   Anticipated DC Date:  06/02/2014   Anticipated DC Plan:  [place]  CM consult      Choice offered to / List presented to:             Status of service:  Completed, signed off Medicare Important Message given?   (If response is "NO", the following Medicare IM given date fields will be blank) Date Medicare IM given:   Medicare IM given by:   Date Additional Medicare IM given:   Additional Medicare IM given by:    Discharge Disposition:  HOME/SELF CARE  Per UR Regulation:    If discussed at Long Length of Stay Meetings, dates discussed:    Comments:  06/02/14 [place], RN BSN CM

## 2014-06-02 NOTE — Progress Notes (Signed)
[facility] PROGRESS NOTE  Patricia Shepherd DJM:[id number] DOB: Sep 24, 1927 DOA: 06/01/2014 PCP: Sherrie Mustache, MD  Assessment/Plan: 1. Syncope. The patient has had several episodes syncope over the last 2 years. All these episodes have occurred while she has been using the bathroom. Suspected vasovagal etiology. Continue to monitor on telemetry for another 24 hours. She also had an element of mild dehydration which could've been contributing to her symptoms. 2. Subacute CVA. Unlikely contributing to #1. Workup including carotid Dopplers, echocardiogram, lipid panel and hemoglobin A1c have been ordered. Will increase baby aspirin to full dose aspirin. She has been seen by physical therapy does not have any deficits. 3. Hypothyroidism. Continue Synthroid. 4. Hyperlipidemia. Continue statin. 5. Dementia 6. Elevated troponin. EKG is nonacute and patient does not have any symptoms. Would not pursue any further workup.  Code Status: Full code Family Communication: Discussed with daughter at the bedside Disposition Plan: Likely discharge home in the morning   Consultants:    Procedures:    Antibiotics:    HPI/Subjective: No new complaints. She does not feel lightheaded or dizzy. No chest pain or shortness of breath.  Objective: Filed Vitals:   06/02/14 1331  BP: 161/71  Pulse: 78  Temp: 98.1 F (36.7 C)  Resp: 18    Intake/Output Summary (Last 24 hours) at 06/02/14 1941 Last data filed at 06/02/14 1852  Gross per 24 hour  Intake   1750 ml  Output    500 ml  Net   1250 ml   Filed Weights   06/01/14 1303 06/01/14 1824  Weight: 58.06 kg (128 lb) 70.761 kg (156 lb)    Exam:   General:  NAD  Cardiovascular: S1, S2 RRR  Respiratory: CTA B  Abdomen: soft, nt, nd, bs+  Musculoskeletal:  No edema b/l   Data Reviewed: Basic Metabolic Panel:  Recent Labs Lab 06/01/14 1316 06/02/14 0506  NA 134* 138  K 3.4* 3.4*  CL 97 100  CO2 31 30  GLUCOSE 121*  103*  BUN 14 9  CREATININE 0.86 0.69  CALCIUM 9.0 9.1   Liver Function Tests:  Recent Labs Lab 06/01/14 1316 06/02/14 0506  AST 23 22  ALT 13 13  ALKPHOS 64 64  BILITOT 0.4 0.6  PROT 7.5 7.0  ALBUMIN 3.6 3.3*    Recent Labs Lab 06/01/14 1316  LIPASE 35   No results for input(s): AMMONIA in the last 168 hours. CBC:  Recent Labs Lab 06/01/14 1316 06/02/14 0506  WBC 11.5* 9.1  NEUTROABS 8.2*  --   HGB 11.3* 11.5*  HCT 34.2* 35.3*  MCV 93.2 93.1  PLT 237 245   Cardiac Enzymes:  Recent Labs Lab 06/01/14 1316 06/01/14 1615 06/01/14 2153 06/02/14 0506  TROPONINI 0.04* 0.03 0.04* 0.04*   BNP (last 3 results) No results for input(s): PROBNP in the last 8760 hours. CBG: No results for input(s): GLUCAP in the last 168 hours.  No results found for this or any previous visit (from the past 240 hour(s)).   Studies: Dg Chest 2 View  06/01/2014   CLINICAL DATA:  Left upper quadrant abdominal pain.  Low back pain.  EXAM: CHEST  2 VIEW  COMPARISON:  Single view of the chest 03/28/2011.  FINDINGS: Mild left basilar atelectasis is noted. The lungs are otherwise clear. Heart size is upper normal. The patient has a small hiatal hernia. No pneumothorax or pleural effusion. Thoracolumbar scoliosis is noted.  IMPRESSION: No acute disease.  Small hiatal hernia.   Electronically Signed  By: Inge Rise M.D.   On: 06/01/2014 14:38   Ct Head Wo Contrast  06/01/2014   CLINICAL DATA:  81 year old female with syncope and generalized weakness  EXAM: CT HEAD WITHOUT CONTRAST  TECHNIQUE: Contiguous axial images were obtained from the base of the skull through the vertex without intravenous contrast.  COMPARISON:  None.  FINDINGS: Negative for acute intracranial hemorrhage, acute infarction, mass, mass effect, hydrocephalus or midline shift. Gray-white differentiation is preserved throughout. Mild ventriculomegaly likely secondary to central atrophy. Focal hypoattenuation in the right  caudate head and PET min consistent with remote lacunar infarcts. Visualize globes and orbits are symmetric bilaterally. No focal soft tissue or calvarial abnormality. Incidental note is made of bilateral concha bullosa. Normal aeration of the mastoid air cells and visualized paranasal sinuses. Atherosclerotic calcifications present in the bilateral cavernous carotid arteries.  IMPRESSION: 1. No acute intracranial abnormality. 2. Ventriculomegaly is likely secondary to central atrophy. However, in the appropriate clinical setting normal pressure hydrocephalus would be difficult to exclude radiographically. 3. Remote lacunar infarcts in the right basal ganglia. 4. Atherosclerotic calcifications in the bilateral cavernous carotid arteries.   Electronically Signed   By: Jacqulynn Cadet M.D.   On: 06/01/2014 15:49   Mr Brain Wo Contrast  06/01/2014   CLINICAL DATA:  81 year old female with syncopal episode today. Left arm and hand weakness. Initial encounter.  EXAM: MRI HEAD WITHOUT CONTRAST  TECHNIQUE: Multiplanar, multiecho pulse sequences of the brain and surrounding structures were obtained without intravenous contrast.  COMPARISON:  Head CT without contrast 1532 hrs.  FINDINGS: Subcentimeter focus of abnormal trace diffusion in the left lower cerebellum near the tonsil (series 100 image 4). This area appears isoechoic intense on ADC map, and demonstrates mild T2 and FLAIR hyperintensity along with mild T1 hypo intensity. No associated hemorrhage or mass effect.  No other restricted diffusion. Major intracranial vascular flow voids are within normal limits; fetal type bilateral PCA origins are suspected.  No midline shift, mass effect, evidence of mass lesion, ventriculomegaly, extra-axial collection or acute intracranial hemorrhage. Cervicomedullary junction and pituitary are within normal limits. Negative visualized cervical spine. Chronic lacunar infarct of the right corona radiata tracking toward the  external capsule. Otherwise mild for age scattered nonspecific cerebral white matter T2 and FLAIR hyperintensity.  Normal bone marrow signal. Postoperative changes to the globes. Trace paranasal sinus mucosal thickening. Mastoids are clear. Visible internal auditory structures appear normal. Visualized scalp soft tissues are within normal limits.  IMPRESSION: 1. Small subacute appearing lacunar infarct in the left cerebellum. No mass effect or hemorrhage. 2. No other acute intracranial abnormality identified, and elsewhere mild for age chronic small vessel ischemia.   Electronically Signed   By: Lars Pinks M.D.   On: 06/01/2014 20:05   US Carotid Bilateral  06/02/2014   CLINICAL DATA:  Stroke.  Syncopal episode.  EXAM: BILATERAL CAROTID DUPLEX ULTRASOUND  TECHNIQUE: Pearline Cables scale imaging, color Doppler and duplex ultrasound were performed of bilateral carotid and vertebral arteries in the neck.  COMPARISON:  Brain MRI - 06/01/2014  FINDINGS: Criteria: Quantification of carotid stenosis is based on velocity parameters that correlate the residual internal carotid diameter with NASCET-based stenosis levels, using the diameter of the distal internal carotid lumen as the denominator for stenosis measurement.  The following velocity measurements were obtained:  RIGHT  ICA:  224/42 cm/sec  CCA:  53/9 cm/sec  SYSTOLIC ICA/CCA RATIO:  3.3  DIASTOLIC ICA/CCA RATIO:  4.9  ECA:  119 cm/sec  LEFT  ICA:  100/16 cm/sec  CCA:  14/97 cm/sec  SYSTOLIC ICA/CCA RATIO:  1.3  DIASTOLIC ICA/CCA RATIO:  1.5  ECA:  111 cm/sec  RIGHT CAROTID ARTERY: There is intimal thickening throughout the right common carotid artery. There is eccentric mixed echogenic the largely hypoechoic plaque within the right carotid bulb (representative images 15 and 17), extending to involve the origin and proximal aspects of the right internal carotid artery (image 27) resulting in elevated peak systolic velocities within the proximal and mid aspect of the right  internal carotid artery (greatest acquired peak systolic velocity within the proximal aspect of the right internal carotid artery measures 224 cm/sec - image 29).  RIGHT VERTEBRAL ARTERY:  Antegrade flow  LEFT CAROTID ARTERY: There is a minimal amount of eccentric echogenic plaque within the distal aspects of the left common carotid artery (51). There is a minimal amount of eccentric mixed echogenic plaque within the left carotid bulb (images 54 and 56), extending to involve the origin and proximal aspects of the left internal carotid artery (image 65), not resulting in elevated peak systolic velocities within the interrogated course of the left internal carotid artery to suggest a hemodynamically significant stenosis.  LEFT VERTEBRAL ARTERY:  Antegrade flow  IMPRESSION: 1. Moderate amount of right-sided atherosclerotic plaque results in elevated peak systolic velocities within the proximal and mid aspect of the right internal carotid artery compatible with the higher end of the 50-69% luminal narrowing range. Further evaluation with CTA could be performed as clinically indicated. 2. Minimal amount of left-sided atherosclerotic plaque, not resulting in a hemodynamically significant stenosis.   Electronically Signed   By: Sandi Mariscal M.D.   On: 06/02/2014 15:24    Scheduled Meds: . aspirin  325 mg Oral Daily  . donepezil  5 mg Oral QHS  . heparin  5,000 Units Subcutaneous 3 times per day  . levothyroxine  50 mcg Oral QAC breakfast  . LORazepam  0.5 mg Oral BID  . memantine  10 mg Oral BID  . potassium chloride  20 mEq Oral QHS  . pravastatin  40 mg Oral QHS  . sodium chloride  3 mL Intravenous Q12H  . vitamin B-12  1,000 mcg Oral Daily   Continuous Infusions: . sodium chloride 50 mL/hr at 06/02/14 1826    Principal Problem:   CVA (cerebral infarction) Active Problems:   Syncope   Dementia   Essential hypertension   Hyperlipidemia   Hypothyroidism   Elevated troponin    Time spent:  54mins    MEMON,JEHANZEB  [facility] Pager [phone]. If 7PM-7AM, please contact night-coverage at [email or web address], password [facility] 06/02/2014, 7:41 PM  LOS: 1 day

## 2014-06-02 NOTE — Progress Notes (Signed)
UR completed 

## 2014-06-02 NOTE — Progress Notes (Deleted)
  Echocardiogram 2D Echocardiogram has been performed.  [place], [place] 06/02/2014, 4:21 PM

## 2014-06-03 DIAGNOSIS — R55 Syncope and collapse: Secondary | ICD-10-CM | POA: Insufficient documentation

## 2014-06-03 LAB — CBC
HCT: 35 % — ABNORMAL LOW (ref 36.0–46.0)
Hemoglobin: 11.3 g/dL — ABNORMAL LOW (ref 12.0–15.0)
MCH: 30.2 pg (ref 26.0–34.0)
MCHC: 32.3 g/dL (ref 30.0–36.0)
MCV: 93.6 fL (ref 78.0–100.0)
Platelets: 254 10*3/uL (ref 150–400)
RBC: 3.74 MIL/uL — ABNORMAL LOW (ref 3.87–5.11)
RDW: 14.7 % (ref 11.5–15.5)
WBC: 8.2 10*3/uL (ref 4.0–10.5)

## 2014-06-03 LAB — BASIC METABOLIC PANEL
Anion gap: 5 (ref 5–15)
BUN: 9 mg/dL (ref 6–23)
CHLORIDE: 101 mmol/L (ref 96–112)
CO2: 27 mmol/L (ref 19–32)
CREATININE: 0.78 mg/dL (ref 0.50–1.10)
Calcium: 8.7 mg/dL (ref 8.4–10.5)
GFR, EST AFRICAN AMERICAN: 87 mL/min — AB (ref >90)
GFR, EST NON AFRICAN AMERICAN: 75 mL/min — AB (ref >90)
GLUCOSE: 92 mg/dL (ref 70–99)
Potassium: 3.7 mmol/L (ref 3.5–5.1)
SODIUM: 133 mmol/L — AB (ref 135–145)

## 2014-06-03 LAB — HEMOGLOBIN A1C
Hgb A1c MFr Bld: 6.8 % — ABNORMAL HIGH (ref 4.8–5.6)
MEAN PLASMA GLUCOSE: 148 mg/dL

## 2014-06-03 MED ORDER — ZOLPIDEM TARTRATE 5 MG PO TABS
5.0000 mg | ORAL_TABLET | Freq: Once | ORAL | Status: AC
  Administered 2014-06-03: 5 mg via ORAL
  Filled 2014-06-03: qty 1

## 2014-06-03 NOTE — Progress Notes (Signed)
Patient and family state understanding of discharge instructions,prescription given, stroke education given .

## 2014-06-03 NOTE — Progress Notes (Signed)
Notified MD, pt daughter requesting Ambien for pt to help pt rest. Awaiting MD's response.

## 2016-06-13 DIAGNOSIS — C4490 Unspecified malignant neoplasm of skin, unspecified: Secondary | ICD-10-CM

## 2016-06-13 HISTORY — DX: Unspecified malignant neoplasm of skin, unspecified: C44.90

## 2016-06-18 ENCOUNTER — Encounter: Payer: Self-pay | Admitting: Radiation Oncology

## 2016-06-20 ENCOUNTER — Encounter: Payer: Self-pay | Admitting: Radiation Oncology

## 2016-06-20 NOTE — Progress Notes (Signed)
Histology and Location of Primary Skin Cancer:  Left Nostril   Patricia Shepherd presented with the following signs/symptoms,   months ago: crust,bleeding  Past/Anticipated interventions by patient's surgeon/dermatologist for current problematic lesion, if any: Dr. Lucillie Garfinkel   Past skin cancers, if any:  1) Location/Histology/Intervention: left nostril 10/23/15=basal cell ca  2) Location/Histology/Intervention:   3) Location/Histology/Intervention:   History of Blistering sunburns, if any:  Always worked in tobacco fields, yes sunburns,  No sunscreens  SAFETY ISSUES: yes,  Left arm weaker than right, unable to stand without someone holding on to her, has a cane at home, w/c here  Prior radiation? NO  Pacemaker/ICD? NO  Is the patient on methotrexate? No  Current Complaints / other details:  HX CVA,dementia,widowed, 3 daughters, has scabbed areas on left side of face, left upper lip and left nostril   Maternal grandfather brain tumor, maternal grandmother skin cancer BP (!) 138/52 (BP Location: Right Arm, Patient Position: Sitting, Cuff Size: Normal)   Pulse 69   Temp 98.4 F (36.9 C) (Oral)   Resp 20   Ht 5\' 4"  (1.626 m)   SpO2 98% Comment: room air  Wt Readings from Last 3 Encounters:  06/01/14 156 lb (70.8 kg)  06/01/14 128 lb (58.1 kg)

## 2016-06-23 ENCOUNTER — Encounter: Payer: Self-pay | Admitting: Radiation Oncology

## 2016-06-23 ENCOUNTER — Ambulatory Visit
Admission: RE | Admit: 2016-06-23 | Discharge: 2016-06-23 | Disposition: A | Discharge status: Home / Self Care | Payer: Medicare Other | Source: Ambulatory Visit | Attending: Radiation Oncology | Admitting: Radiation Oncology

## 2016-06-23 VITALS — BP 138/52 | HR 69 | Temp 98.4°F | Resp 20 | Ht 64.0 in

## 2016-06-23 DIAGNOSIS — C44311 Basal cell carcinoma of skin of nose: Secondary | ICD-10-CM

## 2016-06-23 DIAGNOSIS — I10 Essential (primary) hypertension: Secondary | ICD-10-CM | POA: Diagnosis not present

## 2016-06-23 DIAGNOSIS — Z7982 Long term (current) use of aspirin: Secondary | ICD-10-CM | POA: Diagnosis not present

## 2016-06-23 DIAGNOSIS — F419 Anxiety disorder, unspecified: Secondary | ICD-10-CM | POA: Diagnosis not present

## 2016-06-23 DIAGNOSIS — E079 Disorder of thyroid, unspecified: Secondary | ICD-10-CM | POA: Insufficient documentation

## 2016-06-23 DIAGNOSIS — Z79899 Other long term (current) drug therapy: Secondary | ICD-10-CM | POA: Insufficient documentation

## 2016-06-23 DIAGNOSIS — C4401 Basal cell carcinoma of skin of lip: Secondary | ICD-10-CM | POA: Diagnosis not present

## 2016-06-23 HISTORY — DX: Essential (primary) hypertension: I10

## 2016-06-23 HISTORY — DX: Cerebral infarction, unspecified: I63.9

## 2016-06-23 HISTORY — DX: Diaphragmatic hernia without obstruction or gangrene: K44.9

## 2016-06-23 HISTORY — DX: Unspecified malignant neoplasm of skin, unspecified: C44.90

## 2016-06-23 HISTORY — DX: Syncope and collapse: R55

## 2016-06-23 NOTE — Progress Notes (Signed)
Please see the Nurse Progress Note in the MD Initial Consult Encounter for this patient. 

## 2016-06-23 NOTE — Progress Notes (Signed)
Radiation Oncology         [phone] ________________________________  Name: Patricia Shepherd MRN: [id number]  Date: 06/23/2016  DOB: [birth year]  [id number] ROBERT, MD  Warren Danes, PA-C     REFERRING PHYSICIAN: Warren Danes, PA-C   DIAGNOSIS: The encounter diagnosis was Basal cell carcinoma of dorsum of nose.   HISTORY OF PRESENT ILLNESS: Patricia Shepherd is a 83 y.o. female seen at the request of dermatology, Robyne Askew, PA-C for adiagnosis of basal cell carcinoma of the skin. Biopsy of the left nare on 10/23/2015 showed basal cell carcinoma, superficial and nodular patterns, and was ulcerated. Her daughters were not interested in Mohs surgery due to concerns about cosmesis and possible interruptions in healing .  She was followed expectantly, and her skin continued to be dry and the patient began having bleeding after picking at this area. She was seen again in dermatology. Multiple skin biopsies were performed on 06/13/2016. Biopsy of the left nare and left upper lip revealed basal cell carcinoma, superficial and nodular patterns. There was also a proliferation of atypical basaloid cells forming aggregates arising from the epidermis as well as nodules within the dermis. There was palisading of nuclei at the periphery of these aggregates. 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 The epidermis exhibits full thickness keratinocytic atypia. She also has a lesion that dermatology believes to be basal cell carcinoma on the right chest wall, though this could not be biopsied at the time of her last evaluation due to the patient tiring during the evaluation. The patient is here for discussion of the role for radiation therapy in the treatment of her disease. Upon further discussion we reviewed that as the patient resides in [place], it may be best for her to receive treatment in [place].  PREVIOUS RADIATION THERAPY: No   PAST MEDICAL HISTORY:  Past Medical  History:  Diagnosis Date  . Anxiety   . Arthritis   . Dementia   . Hiatal hernia   . Hypertension   . Peripheral vascular disease ([place])   . Skin cancer 06/13/2016   left nostril- basal cell  . Stroke ([place])   . Syncope   . Thyroid disease        PAST SURGICAL HISTORY:No past surgical history on file.   FAMILY HISTORY:  Family History  Problem Relation Age of Onset  . Brain cancer Father   . Skin cancer Daughter      SOCIAL HISTORY:  reports that she has never smoked. She has never used smokeless tobacco. She reports that she does not drink alcohol or use drugs. The patient is widowed, she lives in the medicine area, she has 24-hour care at home, and has 3 adult daughters.   ALLERGIES: Bee venom   MEDICATIONS:  Current Outpatient Prescriptions  Medication Sig Dispense Refill  . acetaminophen (TYLENOL) 500 MG tablet Take 500-1,000 mg by mouth at bedtime. Takes 2 tablets in the morning, 1 tablet at lunch and 2 tablets at bedtime    . aspirin 325 MG tablet Take 1 tablet (325 mg total) by mouth daily.    . cetirizine (ZYRTEC) 10 MG tablet Take 5 mg by mouth at bedtime.    . donepezil (ARICEPT) 10 MG tablet Take 5 mg by mouth at bedtime.    Marland Kitchen LORazepam (ATIVAN) 0.5 MG tablet 0.5 mg 2 (two) times daily.   2  . memantine (NAMENDA) 10 MG tablet Take 10 mg by mouth 2 (two)  times daily.    . simethicone (MYLICON) 0000000 MG chewable tablet Chew 125 mg by mouth at bedtime.    Marland Kitchen SYNTHROID 50 MCG tablet Take 50 mg by mouth daily.  5  . potassium chloride (K-DUR,KLOR-CON) 10 MEQ tablet Take 20 mEq by mouth at bedtime. Dissolve in water then mix in applesauce    . pravastatin (PRAVACHOL) 40 MG tablet Take 40 mg by mouth at bedtime.  4  . vitamin B-12 (CYANOCOBALAMIN) 1000 MCG tablet Take 1,000 mcg by mouth daily.     No current facility-administered medications for this encounter.      REVIEW OF SYSTEMS: On review of systems, the patient is unable to supply responses, however her  daughters deny any knowledge of the patient having chest pain, shortness of breath, cough, fevers, chills, night sweats, unintended weight changes. There are no known bowel or bladder disturbances, abdominal pain, nausea or vomiting. There is no known pain of her left nose, though this area bleeds easily. The patient does touch her nose a lot, especially when she has a runny nose. A complete review of systems is obtained through the family, and is otherwise negative.    PHYSICAL EXAM:  Wt Readings from Last 3 Encounters:  06/01/14 156 lb (70.8 kg)  06/01/14 128 lb (58.1 kg)   Temp Readings from Last 3 Encounters:  06/23/16 98.4 F (36.9 C) (Oral)  06/03/14 97.8 F (36.6 C) (Oral)   BP Readings from Last 3 Encounters:  06/23/16 (!) 138/52  06/03/14 123/61   Pulse Readings from Last 3 Encounters:  06/23/16 69  06/03/14 62   Pain Assessment Pain Score: 0-No pain/10  In general this is an elderly appearing caucasian female presenting in wheelchair in no acute distress. She is alert and oriented x4 and appropriate throughout the examination. HEENT reveals that the patient is normocephalic, atraumatic. EOMs are intact. PERRLA. Skin lesion at the left nare is 1.2 cm in size and depth of 3 to 4 mm with pearly raised edges. Skin lesion on left upper lip is about 1.3 cm and also ulcerated with raised edges. Skin lesion on left cheek is consistent with ulceration as well and is about 25mm. Skin lesion on anterior chest is about 6 mm, and dry with a central eschar. Cardiovascular exam reveals a regular rate and rhythm, no clicks rubs or murmurs are auscultated. Chest is clear to auscultation bilaterally. Lymphatic assessment is performed and does not reveal any adenopathy in the cervical, supraclavicular, axillary, or inguinal chains. Abdomen has active bowel sounds in all quadrants and is intact. The abdomen is soft, non tender, non distended. Lower extremities are negative for pretibial pitting edema,  deep calf tenderness, cyanosis or clubbing.    ECOG = 0       0 - Asymptomatic (Fully active, able to carry on all predisease activities without restriction)  1 - Symptomatic but completely ambulatory (Restricted in physically strenuous activity but ambulatory and able to carry out work of a light or sedentary nature. For example, light housework, office work)  2 - Symptomatic, <50% in bed during the day (Ambulatory and capable of all self care but unable to carry out any work activities. Up and about more than 50% of waking hours)  3 - Symptomatic, >50% in bed, but not bedbound (Capable of only limited self-care, confined to bed or chair 50% or more of waking hours)  4 - Bedbound (Completely disabled. Cannot carry on any self-care. Totally confined to bed or chair)  5 -  Death   Eustace Pen MM, Creech RH, Tormey DC, et al. [phone]). "Toxicity and response criteria of the [facility]". [place] Oncol. 5 (6): 649-55    LABORATORY DATA:  Lab Results  Component Value Date   WBC 8.2 06/03/2014   HGB 11.3 (L) 06/03/2014   HCT 35.0 (L) 06/03/2014   MCV 93.6 06/03/2014   PLT 254 06/03/2014   Lab Results  Component Value Date   NA 133 (L) 06/03/2014   K 3.7 06/03/2014   CL 101 06/03/2014   CO2 27 06/03/2014   Lab Results  Component Value Date   ALT 13 06/02/2014   AST 22 06/02/2014   ALKPHOS 64 06/02/2014   BILITOT 0.6 06/02/2014      RADIOGRAPHY: No results found.     IMPRESSION/PLAN: 1. 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 Today, we talked to the patient and her two daughters about the findings and work-up thus far.  We discussed the natural history of basal cell carcinoma and squamous cell carcinoma in situ and general treatment, highlighting the role of radiotherapy in the management.  We discussed the available radiation techniques, and focused on the details of logistics and  delivery.  We reviewed the anticipated acute and late sequelae associated with radiation in this setting.  The patient was encouraged to ask questions that we answered to the best of our ability. Dr. Lisbeth Renshaw anticipates 16 fractions therapy over the course of about 3 weeks to each of the locations on her face. Although it would be a consideration to also treat her chest wall, Dr. Lianne Cure, whom she will meet in the [facility] office may request tissue confirmation of this. We outlined the logistics of treating with an open face  mask as well.  The patient's daughters are interested in moving forward with this in [place], and we will arrange for simulation and evaluation with Dr. Lianne Cure for this. 2. Alzheimer's. The patient's limited awareness may be a concern for daily radiation procedure however, the family is quite sure that she will be agreeable to the demands of wearing a mask for treatment, and complying with lying still during treatment.  The above documentation reflects my direct findings during this shared patient visit. Please see the separate note by Dr. Lisbeth Renshaw on this date for the remainder of the patient's plan of care.    Carola Rhine, PAC  This document serves as a record of services personally performed by Kyung Rudd, MD and Shona Simpson, PA-C. It was created on his behalf by Arlyce Harman, a trained medical scribe. The creation of this record is based on the scribe's personal observations and the provider's statements to them. This document has been checked and approved by the attending provider.

## 2016-06-24 ENCOUNTER — Encounter: Payer: Self-pay | Admitting: Radiation Oncology

## 2016-09-20 ENCOUNTER — Emergency Department (HOSPITAL_COMMUNITY)
Admission: EM | Admit: 2016-09-20 | Discharge: 2016-09-20 | Disposition: A | Discharge status: Home / Self Care | Payer: Medicare Other | Attending: Emergency Medicine | Admitting: Emergency Medicine

## 2016-09-20 ENCOUNTER — Encounter (HOSPITAL_COMMUNITY): Payer: Self-pay | Admitting: Emergency Medicine

## 2016-09-20 ENCOUNTER — Emergency Department (HOSPITAL_COMMUNITY): Payer: Medicare Other

## 2016-09-20 DIAGNOSIS — E039 Hypothyroidism, unspecified: Secondary | ICD-10-CM | POA: Diagnosis not present

## 2016-09-20 DIAGNOSIS — E860 Dehydration: Secondary | ICD-10-CM | POA: Diagnosis not present

## 2016-09-20 DIAGNOSIS — Z7982 Long term (current) use of aspirin: Secondary | ICD-10-CM | POA: Diagnosis not present

## 2016-09-20 DIAGNOSIS — Z85828 Personal history of other malignant neoplasm of skin: Secondary | ICD-10-CM | POA: Diagnosis not present

## 2016-09-20 DIAGNOSIS — I10 Essential (primary) hypertension: Secondary | ICD-10-CM | POA: Insufficient documentation

## 2016-09-20 DIAGNOSIS — Z5181 Encounter for therapeutic drug level monitoring: Secondary | ICD-10-CM | POA: Diagnosis not present

## 2016-09-20 DIAGNOSIS — Z79899 Other long term (current) drug therapy: Secondary | ICD-10-CM | POA: Diagnosis not present

## 2016-09-20 DIAGNOSIS — R531 Weakness: Secondary | ICD-10-CM

## 2016-09-20 LAB — URINALYSIS, ROUTINE W REFLEX MICROSCOPIC
BILIRUBIN URINE: NEGATIVE
Bacteria, UA: NONE SEEN
Glucose, UA: NEGATIVE mg/dL
HGB URINE DIPSTICK: NEGATIVE
Ketones, ur: NEGATIVE mg/dL
NITRITE: NEGATIVE
Protein, ur: NEGATIVE mg/dL
Specific Gravity, Urine: 1.006 (ref 1.005–1.030)
pH: 6 (ref 5.0–8.0)

## 2016-09-20 LAB — COMPREHENSIVE METABOLIC PANEL
ALT: 15 U/L (ref 14–54)
AST: 26 U/L (ref 15–41)
Albumin: 3.6 g/dL (ref 3.5–5.0)
Alkaline Phosphatase: 79 U/L (ref 38–126)
Anion gap: 8 (ref 5–15)
BUN: 16 mg/dL (ref 6–20)
CHLORIDE: 103 mmol/L (ref 101–111)
CO2: 26 mmol/L (ref 22–32)
Calcium: 9.3 mg/dL (ref 8.9–10.3)
Creatinine, Ser: 0.91 mg/dL (ref 0.44–1.00)
GFR calc Af Amer: >60 mL/min (ref >60)
GFR calc non Af Amer: 55 mL/min — ABNORMAL LOW (ref >60)
Glucose, Bld: 138 mg/dL — ABNORMAL HIGH (ref 65–99)
POTASSIUM: 3.9 mmol/L (ref 3.5–5.1)
SODIUM: 137 mmol/L (ref 135–145)
TOTAL PROTEIN: 7.7 g/dL (ref 6.5–8.1)
Total Bilirubin: 0.3 mg/dL (ref 0.3–1.2)

## 2016-09-20 LAB — DIFFERENTIAL
BASOS PCT: 0 %
Basophils Absolute: 0 10*3/uL (ref 0.0–0.1)
Eosinophils Absolute: 0.1 10*3/uL (ref 0.0–0.7)
Eosinophils Relative: 1 %
Lymphocytes Relative: 29 %
Lymphs Abs: 3.1 10*3/uL (ref 0.7–4.0)
Monocytes Absolute: 0.7 10*3/uL (ref 0.1–1.0)
Monocytes Relative: 7 %
NEUTROS PCT: 63 %
Neutro Abs: 6.7 10*3/uL (ref 1.7–7.7)

## 2016-09-20 LAB — CBC
HCT: 39 % (ref 36.0–46.0)
Hemoglobin: 12.6 g/dL (ref 12.0–15.0)
MCH: 30.2 pg (ref 26.0–34.0)
MCHC: 32.3 g/dL (ref 30.0–36.0)
MCV: 93.5 fL (ref 78.0–100.0)
PLATELETS: 253 10*3/uL (ref 150–400)
RBC: 4.17 MIL/uL (ref 3.87–5.11)
RDW: 13.6 % (ref 11.5–15.5)
WBC: 10.6 10*3/uL — ABNORMAL HIGH (ref 4.0–10.5)

## 2016-09-20 LAB — PROTIME-INR
INR: 0.98
Prothrombin Time: 13 seconds (ref 11.4–15.2)

## 2016-09-20 LAB — I-STAT TROPONIN, ED: Troponin i, poc: 0 ng/mL (ref 0.00–0.08)

## 2016-09-20 LAB — APTT: APTT: 38 s — AB (ref 24–36)

## 2016-09-20 MED ORDER — SODIUM CHLORIDE 0.9 % IV BOLUS (SEPSIS)
500.0000 mL | Freq: Once | INTRAVENOUS | Status: AC
  Administered 2016-09-20: 500 mL via INTRAVENOUS

## 2016-09-20 NOTE — ED Triage Notes (Signed)
Pt brought in by ems for generalized weakness.  Family reported pt had constricted pupils when she woke up and her bp was only 110/60 so they thought she may have something wrong.  Pt denies complaints.

## 2016-09-20 NOTE — ED Provider Notes (Signed)
Emergency Department Provider Note   I have reviewed the triage vital signs and the nursing notes.   HISTORY  Chief Complaint Weakness   HPI Patricia Shepherd is a 83 y.o. female with PMH of CVA, HTN, PVD, and Dementia arrives for evaluation of generalized weakness, stool incontinence, gait abnormality. The patient's daughters are at bedside and state this morning she was far more weak this morning and had difficulty with ambulation. At times it seemed that she was dragging her left foot behind her. Typically, she ambulates with a cane and some mild assistance. This morning she needed significant assistance with getting to the bathroom and had 2 episodes of stool incontinence. No fever or shaking chills. Family denies worsening confusion or speech changes. They're concerned about "mini strokes" and states that she's had many in the past. Patient is DNR with paperwork at bedside.   Level 5 caveat: Dementia.   Past Medical History:  Diagnosis Date  . Anxiety   . Arthritis   . Dementia   . Hiatal hernia   . Hypertension   . Peripheral vascular disease ([place])   . Skin cancer 06/13/2016   left nostril- basal cell  . Stroke ([place])   . Syncope   . Thyroid disease     Patient Active Problem List   Diagnosis Date Noted  . Faintness   . Essential hypertension 06/02/2014  . Hyperlipidemia 06/02/2014  . Hypothyroidism 06/02/2014  . CVA (cerebral infarction) 06/02/2014  . Elevated troponin 06/02/2014  . Syncope 06/01/2014  . Dementia 06/01/2014    History reviewed. No pertinent surgical history.  Current Outpatient Rx  . Order #: [id number] Class: Historical Med  . Order #: [id number] Class: OTC  . Order #: [id number] Class: Historical Med  . Order #: [id number] Class: Historical Med  . Order #: [id number] Class: Historical Med  . Order #: [id number] Class: Historical Med  . Order #: [id number] Class: Historical Med  . Order #: [id number] Class: Historical Med  . Order #: [id number] Class: Historical  Med  . Order #: [id number] Class: Historical Med  . Order #: [id number] Class: Historical Med    Allergies Bee venom  Family History  Problem Relation Age of Onset  . Brain cancer Father   . Skin cancer Daughter     Social History Social History  Substance Use Topics  . Smoking status: Never Smoker  . Smokeless tobacco: Never Used  . Alcohol use No    Review of Systems  Level 5 caveat: Dementia ____________________________________________   PHYSICAL EXAM:  VITAL SIGNS: ED Triage Vitals [09/20/16 1646]  Enc Vitals Group     BP (!) 149/75     Pulse Rate 89     Resp 18     Temp 98.5 F (36.9 C)     Temp Source Oral     SpO2 96 %     Weight 130 lb (59 kg)     Height 5\' 4"  (1.626 m)   Constitutional: Alert but confused. Well appearing and in no acute distress. Eyes: Conjunctivae are normal. Head: Atraumatic. Nose: No congestion/rhinnorhea. Mouth/Throat: Mucous membranes are dry.  Neck: No stridor.   Cardiovascular: Normal rate, regular rhythm. Good peripheral circulation. Grossly normal heart sounds.   Respiratory: Normal respiratory effort.  No retractions. Lungs CTAB. Gastrointestinal: Soft and nontender. No distention.  Musculoskeletal: No lower extremity tenderness nor edema. No gross deformities of extremities. Chronic hand/finger deformity in the left hand.  Neurologic:  Normal speech and language. 4+/5 LUE weakness. 5/5 strength in RUE and B/L LEs.  Skin:  Skin is warm, dry and intact. No rash noted.  ____________________________________________   LABS (all labs ordered are listed, but only abnormal results are displayed)  Labs Reviewed  APTT - Abnormal; Notable for the following:       Result Value   aPTT 38 (*)    All other components within normal limits  CBC - Abnormal; Notable for the following:    WBC 10.6 (*)    All other components within normal limits  COMPREHENSIVE METABOLIC PANEL - Abnormal; Notable for the following:    Glucose, Bld 138  (*)    GFR calc non Af Amer 55 (*)    All other components within normal limits  URINALYSIS, ROUTINE W REFLEX MICROSCOPIC - Abnormal; Notable for the following:    Color, Urine STRAW (*)    Leukocytes, UA SMALL (*)    Squamous Epithelial / LPF 0-5 (*)    All other components within normal limits  PROTIME-INR  DIFFERENTIAL  I-STAT TROPOININ, ED   ____________________________________________  EKG   EKG Interpretation  Date/Time:  Saturday Sep 20 2016 17:39:37 EDT Ventricular Rate:  84 PR Interval:    QRS Duration: 98 QT Interval:  404 QTC Calculation: 478 R Axis:   44 Text Interpretation:  Sinus rhythm Prolonged PR interval Borderline repolarization abnormality No STEMI.  Confirmed by Nanda Quinton [phone]) on 09/20/2016 6:16:40 PM       ____________________________________________  RADIOLOGY  Dg Chest 2 View  Result Date: 09/20/2016 CLINICAL DATA:  Leg weakness. EXAM: CHEST  2 VIEW COMPARISON:  June 01, 2014 FINDINGS: Opacity in the lateral left lung base is unchanged since May 06, 2014 and similar since 2012, consistent with scarring. No pneumothorax. No other interval changes in the chest. Anterior wedging of a mid thoracic vertebral body was not seen in January 2016 and is age indeterminate. IMPRESSION: 1. Anterior wedging of a mid thoracic vertebral body with approximately 15% loss of anterior height is new since January 2016. Recommend clinical correlation. 2. No other interval changes or acute abnormalities. Electronically Signed   By: Dorise Bullion III M.D   On: 09/20/2016 17:44   Ct Head Wo Contrast  Result Date: 09/20/2016 CLINICAL DATA:  Generalized weakness. EXAM: CT HEAD WITHOUT CONTRAST TECHNIQUE: Contiguous axial images were obtained from the base of the skull through the vertex without intravenous contrast. COMPARISON:  June 01, 2014 FINDINGS: Brain: No subdural, epidural, or subarachnoid hemorrhage identified. A lacunar infarct is seen in the right basal  ganglia. The ventricles and sulci are prominent but stable. Cerebellum, brainstem, and basal cisterns are normal. No mass effect or midline shift. White matter changes are identified. No acute cortical ischemia or infarct. Vascular: Calcified atherosclerosis is seen in the intracranial carotid arteries. Skull: Normal. Negative for fracture or focal lesion. Sinuses/Orbits: No acute finding. Other: None. IMPRESSION: No acute abnormality identified to explain the patient's symptoms. Electronically Signed   By: Dorise Bullion III M.D   On: 09/20/2016 17:50    ____________________________________________   PROCEDURES  Procedure(s) performed:   Procedures  None ____________________________________________   INITIAL IMPRESSION / ASSESSMENT AND PLAN / ED COURSE  Pertinent labs & imaging results that were available during my care of the patient were reviewed by me and considered in my medical decision making (see chart for details).  Patient presents to the emergency department for evaluation of generalized weakness and stool incontinence. Family notes some possible gait abnormality. Patient has had prior strokes with some residual left upper extremity weakness. No focal  neuro deficit on my exam. Unclear if this represents TIA versus infectious process versus metabolic abnormality. Will evaluate for possible TIA and also obtain CXR as the patient arrived on O2 with EMS. Normal O2 on RA here.   Labs and imaging are unremarkable. No neuro deficits here. I had a long discussion with family about further w/u for TIA. The patient is DNR and they would prefer to continue any w/u as an outpatient as opposed to admit, transfer, and MRI today. This plan seems reasonable given their goals of care.   At this time, I do not feel there is any life-threatening condition present. I have reviewed and discussed all results (EKG, imaging, lab, urine as appropriate), exam findings with patient. I have reviewed nursing  notes and appropriate previous records.  I feel the patient is safe to be discharged home without further emergent workup. Discussed usual and customary return precautions. Patient and family (if present) verbalize understanding and are comfortable with this plan.  Patient will follow-up with their primary care provider. If they do not have a primary care provider, information for follow-up has been provided to them. All questions have been answered.  ____________________________________________  FINAL CLINICAL IMPRESSION(S) / ED DIAGNOSES  Final diagnoses:  Weakness  Dehydration     MEDICATIONS GIVEN DURING THIS VISIT:  Medications  sodium chloride 0.9 % bolus 500 mL (0 mLs Intravenous Stopped 09/20/16 1847)     NEW OUTPATIENT MEDICATIONS STARTED DURING THIS VISIT:  None   Note:  This document was prepared using Dragon voice recognition software and may include unintentional dictation errors.  Nanda Quinton, MD Emergency Medicine   Long, [facility], MD 09/20/16 2250

## 2016-09-20 NOTE — Discharge Instructions (Signed)
As we discussed, we believe her symptoms are caused today by mild volume depletion, or mild dehydration, without any evidence of damage to your body.  Please drink plenty of clear fluids such as water and/or Gatorade and follow up with your regular doctor or the doctors listed in his documentation at the next available opportunity.  Return to the emergency department with any new or worsening symptoms that concern you, including but not limited to fever, shortness of breath, chest pain, or other concerning symptoms. ° ° °Dehydration, Adult °Dehydration is when you lose more fluids from the body than you take in. Vital organs like the kidneys, brain, and heart cannot function without a proper amount of fluids and salt. Any loss of fluids from the body can cause dehydration.  °CAUSES  °Vomiting. °Diarrhea. °Excessive sweating. °Excessive urine output. °Fever. °SYMPTOMS  °Mild dehydration °Thirst. °Dry lips. °Slightly dry mouth. °Moderate dehydration °Very dry mouth. °Sunken eyes. °Skin does not bounce back quickly when lightly pinched and released. °Dark urine and decreased urine production. °Decreased tear production. °Headache. °Severe dehydration °Very dry mouth. °Extreme thirst. °Rapid, weak pulse (more than 100 beats per minute at rest). °Cold hands and feet. °Not able to sweat in spite of heat and temperature. °Rapid breathing. °Blue lips. °Confusion and lethargy. °Difficulty being awakened. °Minimal urine production. °No tears. °DIAGNOSIS  °Your caregiver will diagnose dehydration based on your symptoms and your exam. Blood and urine tests will help confirm the diagnosis. The diagnostic evaluation should also identify the cause of dehydration. °TREATMENT  °Treatment of mild or moderate dehydration can often be done at home by increasing the amount of fluids that you drink. It is best to drink small amounts of fluid more often. Drinking too much at one time can make vomiting worse. Refer to the home care  instructions below. °Severe dehydration needs to be treated at the hospital where you will probably be given intravenous (IV) fluids that contain water and electrolytes. °HOME CARE INSTRUCTIONS  °Ask your caregiver about specific rehydration instructions. °Drink enough fluids to keep your urine clear or pale yellow. °Drink small amounts frequently if you have nausea and vomiting. °Eat as you normally do. °Avoid: °Foods or drinks high in sugar. °Carbonated drinks. °Juice. °Extremely hot or cold fluids. °Drinks with caffeine. °Fatty, greasy foods. °Alcohol. °Tobacco. °Overeating. °Gelatin desserts. °Wash your hands well to avoid spreading bacteria and viruses. °Only take over-the-counter or prescription medicines for pain, discomfort, or fever as directed by your caregiver. °Ask your caregiver if you should continue all prescribed and over-the-counter medicines. °Keep all follow-up appointments with your caregiver. °SEEK MEDICAL CARE IF: °You have abdominal pain and it increases or stays in one area (localizes). °You have a rash, stiff neck, or severe headache. °You are irritable, sleepy, or difficult to awaken. °You are weak, dizzy, or extremely thirsty. °SEEK IMMEDIATE MEDICAL CARE IF:  °You are unable to keep fluids down or you get worse despite treatment. °You have frequent episodes of vomiting or diarrhea. °You have blood or green matter (bile) in your vomit. °You have blood in your stool or your stool looks black and tarry. °You have not urinated in 6 to 8 hours, or you have only urinated a small amount of very dark urine. °You have a fever. °You faint. °MAKE SURE YOU:  °Understand these instructions. °Will watch your condition. °Will get help right away if you are not doing well or get worse. °Document Released: 04/21/2005 Document Revised: 07/14/2011 Document Reviewed: 12/09/2010 °Exit[employer]® Patient Information ©2015   [employer]. This information is not intended to replace advice given to you by your health  care provider. Make sure you discuss any questions you have with your health care provider. ° °Rehydration, Adult °Rehydration is the replacement of body fluids lost during dehydration. Dehydration is an extreme loss of body fluids to the point of body function impairment. There are many ways extreme fluid loss can occur, including vomiting, diarrhea, or excess sweating. Recovering from dehydration requires replacing lost fluids, continuing to eat to maintain strength, and avoiding foods and beverages that may contribute to further fluid loss or may increase nausea. °HOW TO REHYDRATE °In most cases, rehydration involves the replacement of not only fluids but also carbohydrates and basic body salts. Rehydration with an oral rehydration solution is one way to replace essential nutrients lost through dehydration. °An oral rehydration solution can be purchased at pharmacies, retail stores, and online. Premixed packets of powder that you combine with water to make a solution are also sold. You can prepare an oral rehydration solution at home by mixing the following ingredients together:  ° - tsp table salt. °¾ tsp baking soda. ° tsp salt substitute containing potassium chloride. °1 tablespoons sugar. °1 L (34 oz) of water. °Be sure to use exact measurements. Including too much sugar can make diarrhea worse. °Drink ½-1 cup (120-240 mL) of oral rehydration solution each time you have diarrhea or vomit. If drinking this amount makes your vomiting worse, try drinking smaller amounts more often. For example, drink 1-3 tsp every 5-10 minutes.  °A general rule for staying hydrated is to drink 1½-2 L of fluid per day. Talk to your caregiver about the specific amount you should be drinking each day. Drink enough fluids to keep your urine clear or pale yellow. °EATING WHEN DEHYDRATED °Even if you have had severe sweating or you are having diarrhea, do not stop eating. Many healthy items in a normal diet are okay to continue eating  while recovering from dehydration. The following tips can help you to lessen nausea when you eat: °Ask someone else to prepare your food. Cooking smells may worsen nausea. °Eat in a well-ventilated room away from cooking smells. °Sit up when you eat. Avoid lying down until 1-2 hours after eating. °Eat small amounts when you eat. °Eat foods that are easy to digest. These include soft, well-cooked, or mashed foods. °FOODS AND BEVERAGES TO AVOID °Avoid eating or drinking the following foods and beverages that may increase nausea or further loss of fluid:  °Fruit juices with a high sugar content, such as concentrated juices. °Alcohol. °Beverages containing caffeine. °Carbonated drinks. They may cause a lot of gas. °Foods that may cause a lot of gas, such as cabbage, broccoli, and beans. °Fatty, greasy, and fried foods. °Spicy, very salty, and very sweet foods or drinks. °Foods or drinks that are very hot or very cold. Consume food or drinks at or near room temperature. °Foods that need a lot of chewing, such as raw vegetables. °Foods that are sticky or hard to swallow, such as peanut butter. °Document Released: 07/14/2011 Document Revised: 01/14/2012 Document Reviewed: 07/14/2011 °ExitCare® Patient Information ©2015 [employer]. This information is not intended to replace advice given to you by your health care provider. Make sure you discuss any questions you have with your health care provider. ° ° ° °

## 2016-10-21 ENCOUNTER — Emergency Department (HOSPITAL_COMMUNITY): Payer: Medicare Other

## 2016-10-21 ENCOUNTER — Inpatient Hospital Stay (HOSPITAL_COMMUNITY)
Admission: EM | Admit: 2016-10-21 | Discharge: 2016-10-25 | DRG: 470 | Disposition: A | Discharge status: Skilled Nursing Facility | Payer: Medicare Other | Attending: Family Medicine | Admitting: Family Medicine

## 2016-10-21 ENCOUNTER — Encounter (HOSPITAL_COMMUNITY): Payer: Self-pay

## 2016-10-21 DIAGNOSIS — I10 Essential (primary) hypertension: Secondary | ICD-10-CM | POA: Diagnosis present

## 2016-10-21 DIAGNOSIS — E861 Hypovolemia: Secondary | ICD-10-CM | POA: Diagnosis present

## 2016-10-21 DIAGNOSIS — S72001D Fracture of unspecified part of neck of right femur, subsequent encounter for closed fracture with routine healing: Secondary | ICD-10-CM | POA: Diagnosis not present

## 2016-10-21 DIAGNOSIS — Z8673 Personal history of transient ischemic attack (TIA), and cerebral infarction without residual deficits: Secondary | ICD-10-CM

## 2016-10-21 DIAGNOSIS — M25551 Pain in right hip: Secondary | ICD-10-CM | POA: Diagnosis present

## 2016-10-21 DIAGNOSIS — M1990 Unspecified osteoarthritis, unspecified site: Secondary | ICD-10-CM | POA: Diagnosis present

## 2016-10-21 DIAGNOSIS — I351 Nonrheumatic aortic (valve) insufficiency: Secondary | ICD-10-CM

## 2016-10-21 DIAGNOSIS — R569 Unspecified convulsions: Secondary | ICD-10-CM | POA: Diagnosis present

## 2016-10-21 DIAGNOSIS — I69354 Hemiplegia and hemiparesis following cerebral infarction affecting left non-dominant side: Secondary | ICD-10-CM | POA: Diagnosis not present

## 2016-10-21 DIAGNOSIS — Z91030 Bee allergy status: Secondary | ICD-10-CM | POA: Diagnosis not present

## 2016-10-21 DIAGNOSIS — D72829 Elevated white blood cell count, unspecified: Secondary | ICD-10-CM | POA: Diagnosis present

## 2016-10-21 DIAGNOSIS — Z7982 Long term (current) use of aspirin: Secondary | ICD-10-CM | POA: Diagnosis not present

## 2016-10-21 DIAGNOSIS — I739 Peripheral vascular disease, unspecified: Secondary | ICD-10-CM | POA: Diagnosis present

## 2016-10-21 DIAGNOSIS — F419 Anxiety disorder, unspecified: Secondary | ICD-10-CM | POA: Diagnosis not present

## 2016-10-21 DIAGNOSIS — I110 Hypertensive heart disease with heart failure: Secondary | ICD-10-CM | POA: Diagnosis present

## 2016-10-21 DIAGNOSIS — S72001A Fracture of unspecified part of neck of right femur, initial encounter for closed fracture: Principal | ICD-10-CM | POA: Diagnosis present

## 2016-10-21 DIAGNOSIS — E039 Hypothyroidism, unspecified: Secondary | ICD-10-CM | POA: Diagnosis not present

## 2016-10-21 DIAGNOSIS — G309 Alzheimer's disease, unspecified: Secondary | ICD-10-CM | POA: Diagnosis not present

## 2016-10-21 DIAGNOSIS — I5032 Chronic diastolic (congestive) heart failure: Secondary | ICD-10-CM | POA: Diagnosis not present

## 2016-10-21 DIAGNOSIS — M810 Age-related osteoporosis without current pathological fracture: Secondary | ICD-10-CM | POA: Diagnosis present

## 2016-10-21 DIAGNOSIS — F0390 Unspecified dementia without behavioral disturbance: Secondary | ICD-10-CM | POA: Diagnosis present

## 2016-10-21 DIAGNOSIS — F0280 Dementia in other diseases classified elsewhere without behavioral disturbance: Secondary | ICD-10-CM | POA: Diagnosis not present

## 2016-10-21 DIAGNOSIS — W19XXXA Unspecified fall, initial encounter: Secondary | ICD-10-CM

## 2016-10-21 DIAGNOSIS — R262 Difficulty in walking, not elsewhere classified: Secondary | ICD-10-CM | POA: Diagnosis not present

## 2016-10-21 DIAGNOSIS — D62 Acute posthemorrhagic anemia: Secondary | ICD-10-CM | POA: Diagnosis not present

## 2016-10-21 DIAGNOSIS — Z01818 Encounter for other preprocedural examination: Secondary | ICD-10-CM

## 2016-10-21 DIAGNOSIS — E785 Hyperlipidemia, unspecified: Secondary | ICD-10-CM | POA: Diagnosis not present

## 2016-10-21 DIAGNOSIS — M6281 Muscle weakness (generalized): Secondary | ICD-10-CM | POA: Diagnosis not present

## 2016-10-21 DIAGNOSIS — Z66 Do not resuscitate: Secondary | ICD-10-CM | POA: Diagnosis present

## 2016-10-21 DIAGNOSIS — Z808 Family history of malignant neoplasm of other organs or systems: Secondary | ICD-10-CM

## 2016-10-21 DIAGNOSIS — Z96649 Presence of unspecified artificial hip joint: Secondary | ICD-10-CM

## 2016-10-21 DIAGNOSIS — M12551 Traumatic arthropathy, right hip: Secondary | ICD-10-CM | POA: Diagnosis not present

## 2016-10-21 DIAGNOSIS — W010XXA Fall on same level from slipping, tripping and stumbling without subsequent striking against object, initial encounter: Secondary | ICD-10-CM | POA: Diagnosis present

## 2016-10-21 DIAGNOSIS — Z85828 Personal history of other malignant neoplasm of skin: Secondary | ICD-10-CM | POA: Diagnosis not present

## 2016-10-21 DIAGNOSIS — Z9181 History of falling: Secondary | ICD-10-CM | POA: Diagnosis not present

## 2016-10-21 DIAGNOSIS — X58XXXD Exposure to other specified factors, subsequent encounter: Secondary | ICD-10-CM | POA: Diagnosis not present

## 2016-10-21 DIAGNOSIS — Z419 Encounter for procedure for purposes other than remedying health state, unspecified: Secondary | ICD-10-CM

## 2016-10-21 HISTORY — DX: Unspecified convulsions: R56.9

## 2016-10-21 LAB — CBC WITH DIFFERENTIAL/PLATELET
BASOS PCT: 0 %
Basophils Absolute: 0 10*3/uL (ref 0.0–0.1)
EOS ABS: 0.2 10*3/uL (ref 0.0–0.7)
Eosinophils Relative: 1 %
HCT: 38.3 % (ref 36.0–46.0)
HEMOGLOBIN: 12.4 g/dL (ref 12.0–15.0)
Lymphocytes Relative: 20 %
Lymphs Abs: 2.5 10*3/uL (ref 0.7–4.0)
MCH: 30.3 pg (ref 26.0–34.0)
MCHC: 32.4 g/dL (ref 30.0–36.0)
MCV: 93.6 fL (ref 78.0–100.0)
MONO ABS: 0.9 10*3/uL (ref 0.1–1.0)
MONOS PCT: 7 %
NEUTROS PCT: 72 %
Neutro Abs: 9.2 10*3/uL — ABNORMAL HIGH (ref 1.7–7.7)
PLATELETS: 237 10*3/uL (ref 150–400)
RBC: 4.09 MIL/uL (ref 3.87–5.11)
RDW: 14.2 % (ref 11.5–15.5)
WBC: 12.8 10*3/uL — ABNORMAL HIGH (ref 4.0–10.5)

## 2016-10-21 LAB — BASIC METABOLIC PANEL
Anion gap: 11 (ref 5–15)
BUN: 13 mg/dL (ref 6–20)
CALCIUM: 9.2 mg/dL (ref 8.9–10.3)
CO2: 22 mmol/L (ref 22–32)
CREATININE: 0.8 mg/dL (ref 0.44–1.00)
Chloride: 105 mmol/L (ref 101–111)
GFR calc non Af Amer: >60 mL/min (ref >60)
Glucose, Bld: 109 mg/dL — ABNORMAL HIGH (ref 65–99)
Potassium: 3.6 mmol/L (ref 3.5–5.1)
SODIUM: 138 mmol/L (ref 135–145)

## 2016-10-21 LAB — PROTIME-INR
INR: 0.94
PROTHROMBIN TIME: 12.6 s (ref 11.4–15.2)

## 2016-10-21 MED ORDER — HYDRALAZINE HCL 20 MG/ML IJ SOLN: 10.0000 mg | INTRAMUSCULAR | Status: DC | PRN

## 2016-10-21 MED ORDER — PRAVASTATIN SODIUM 40 MG PO TABS
40.0000 mg | ORAL_TABLET | Freq: Every day | ORAL | Status: DC
  Administered 2016-10-22 – 2016-10-24 (×4): 40 mg via ORAL
  Filled 2016-10-21 (×4): qty 1

## 2016-10-21 MED ORDER — POLYETHYLENE GLYCOL 3350 17 G PO PACK: 17.0000 g | PACK | Freq: Every day | ORAL | Status: DC | PRN

## 2016-10-21 MED ORDER — SODIUM CHLORIDE 0.9 % IV SOLN
INTRAVENOUS | Status: DC
  Administered 2016-10-22 (×2): via INTRAVENOUS

## 2016-10-21 MED ORDER — HYDROCODONE-ACETAMINOPHEN 5-325 MG PO TABS
1.0000 | ORAL_TABLET | Freq: Four times a day (QID) | ORAL | Status: DC | PRN
  Administered 2016-10-22 (×2): 2 via ORAL
  Administered 2016-10-23 (×2): 1 via ORAL
  Administered 2016-10-23 – 2016-10-24 (×4): 2 via ORAL
  Filled 2016-10-21 (×3): qty 2
  Filled 2016-10-21: qty 1
  Filled 2016-10-21 (×2): qty 2
  Filled 2016-10-21: qty 1
  Filled 2016-10-21 (×2): qty 2

## 2016-10-21 MED ORDER — LORATADINE 10 MG PO TABS
10.0000 mg | ORAL_TABLET | Freq: Every day | ORAL | Status: DC
  Administered 2016-10-23 – 2016-10-25 (×3): 10 mg via ORAL
  Filled 2016-10-21 (×4): qty 1

## 2016-10-21 MED ORDER — ZOLPIDEM TARTRATE 5 MG PO TABS: 5.0000 mg | ORAL_TABLET | Freq: Every evening | ORAL | Status: DC | PRN

## 2016-10-21 MED ORDER — BISACODYL 5 MG PO TBEC
5.0000 mg | DELAYED_RELEASE_TABLET | Freq: Every day | ORAL | Status: DC | PRN
  Filled 2016-10-21: qty 1

## 2016-10-21 MED ORDER — SIMETHICONE 80 MG PO CHEW
80.0000 mg | CHEWABLE_TABLET | Freq: Every day | ORAL | Status: DC
  Administered 2016-10-22 – 2016-10-24 (×3): 80 mg via ORAL
  Filled 2016-10-21 (×3): qty 1

## 2016-10-21 MED ORDER — LORAZEPAM 0.5 MG PO TABS
0.5000 mg | ORAL_TABLET | Freq: Two times a day (BID) | ORAL | Status: DC
  Administered 2016-10-22 – 2016-10-25 (×6): 0.5 mg via ORAL
  Filled 2016-10-21 (×6): qty 1

## 2016-10-21 MED ORDER — LEVOTHYROXINE SODIUM 50 MCG PO TABS
50.0000 ug | ORAL_TABLET | Freq: Every day | ORAL | Status: DC
  Administered 2016-10-23 – 2016-10-25 (×3): 50 ug via ORAL
  Filled 2016-10-21 (×4): qty 1

## 2016-10-21 MED ORDER — DOCUSATE SODIUM 100 MG PO CAPS
100.0000 mg | ORAL_CAPSULE | Freq: Two times a day (BID) | ORAL | Status: DC
  Administered 2016-10-22 – 2016-10-25 (×6): 100 mg via ORAL
  Filled 2016-10-21 (×7): qty 1

## 2016-10-21 MED ORDER — DONEPEZIL HCL 5 MG PO TABS
5.0000 mg | ORAL_TABLET | Freq: Every day | ORAL | Status: DC
  Administered 2016-10-22 – 2016-10-24 (×4): 5 mg via ORAL
  Filled 2016-10-21 (×4): qty 1

## 2016-10-21 MED ORDER — MEMANTINE HCL 10 MG PO TABS
10.0000 mg | ORAL_TABLET | Freq: Two times a day (BID) | ORAL | Status: DC
  Administered 2016-10-22 – 2016-10-25 (×6): 10 mg via ORAL
  Filled 2016-10-21 (×8): qty 1

## 2016-10-21 MED ORDER — MORPHINE SULFATE (PF) 4 MG/ML IV SOLN
1.0000 mg | INTRAVENOUS | Status: DC | PRN
  Administered 2016-10-22 – 2016-10-25 (×5): 1 mg via INTRAVENOUS
  Filled 2016-10-21 (×5): qty 1

## 2016-10-21 MED ORDER — FENTANYL CITRATE (PF) 100 MCG/2ML IJ SOLN
50.0000 ug | INTRAMUSCULAR | Status: AC | PRN
  Administered 2016-10-21 (×2): 50 ug via INTRAVENOUS
  Filled 2016-10-21 (×2): qty 2

## 2016-10-21 NOTE — ED Triage Notes (Signed)
Pt BIB [facility] from home where family reports they were helping the pt to the RR when she fell on family and rolled of landing on bathroom floor on right hip. Family reports hx of dementia and states someone is always at home with the pt. VSS. No meds given PTA.

## 2016-10-21 NOTE — H&P (Signed)
History and Physical    Patricia Shepherd AJO:[id number] DOB: 1927-12-15 DOA: 10/21/2016  PCP: Dione Housekeeper, MD   Patient coming from: Home  Chief Complaint: Fall with right hip pain   HPI: Patricia Shepherd is a 83 y.o. female with medical history significant for history of ischemic strokes, Alzheimer dementia, hypothyroidism, hypertension, chronic diastolic CHF, and moderate aortic regurgitation, now presenting to the emergency department with severe right hip pain after a fall at home. Patient is accompanied by her 2 daughters who assist with the history. Patient had reportedly been in her usual state of health and was being assisted in the bathroom by her caregiver when she lost balance, falling onto her right side. She complained of immediate pain at the right hip. She was partially caught as she fell and did not strike her head. Leading up to this, she was having an uneventful day with no recent illness. Per the report of her daughters, she had been very active physically throughout her life and well into her 23s and tell a CVA in 2015 which was followed by cognitive difficulties and worsening dementia. Around this time, she also began to be less mobile. Currently, at her baseline, she is able to ambulate with a cane and minimal assistance. She ambulates about her home like this without any dyspnea. She does not have any known coronary artery disease and has never complained of angina or palpitations. She had an echo in 2016 that demonstrates moderate aortic insufficiency and grade 2 diastolic dysfunction, but she has not had any volume issues or required diuresis. She takes a daily aspirin 325 mg for secondary stroke prophylaxis.  ED Course: Upon arrival to the ED, patient is found to be afebrile, saturating adequately on room air, and with vitals otherwise stable. EKG has been ordered but not yet performed. Chest x-ray is negative for any acute cardiac pulmonary disease. Chemistry panel is unremarkable  and CBC is notable only for a leukocytosis to 12,800. INR is within the normal limits. Urinalysis remains pending. Radiographs of the right hip and femur demonstrate an acute nondisplaced right femoral neck fracture without dislocation. Type and screen was performed and the patient was treated with 2 doses of fentanyl 50 g. Orthopedic surgery was consulted by the ED physician and advised for a medical admission, requesting the patient be kept nothing by mouth after midnight. Patricia Shepherd remained hemodynamically stable in the emergency department and has not been in any apparent distress, and she will be admitted to [facility] of her ongoing evaluation and management of right femoral neck fracture.   Review of Systems:  Unable to obtain secondary to clinical condition was advanced dementia.  Past Medical History:  Diagnosis Date  . Anxiety   . Arthritis   . Dementia   . Hiatal hernia   . Hypertension   . Peripheral vascular disease ([place])   . Skin cancer 06/13/2016   left nostril- basal cell  . Stroke ([place])   . Syncope   . Thyroid disease     History reviewed. No pertinent surgical history.   reports that she has never smoked. She has never used smokeless tobacco. She reports that she does not drink alcohol or use drugs.  Allergies  Allergen Reactions  . Bee Venom Swelling    Family History  Problem Relation Age of Onset  . Brain cancer Father   . Skin cancer Daughter      Prior to Admission medications   Medication Sig Start Date End Date  Taking? Authorizing Provider  acetaminophen (TYLENOL) 500 MG tablet Take 500-1,000 mg by mouth at bedtime. Takes 2 tablets in the morning, 1 tablet at lunch and 2 tablets at bedtime   Yes [provider]  aspirin 325 MG tablet Take 1 tablet (325 mg total) by mouth daily. 06/02/14  Yes Black, Lezlie Octave, NP  cetirizine (ZYRTEC) 10 MG tablet Take 5 mg by mouth at bedtime.   Yes [provider]  docusate sodium (COLACE) 100 MG  capsule Take 100 mg by mouth See admin instructions. Every other day   Yes [provider]  donepezil (ARICEPT) 10 MG tablet Take 5 mg by mouth at bedtime. 04/10/14  Yes [provider]  LORazepam (ATIVAN) 0.5 MG tablet 0.5 mg 2 (two) times daily.  05/30/14  Yes [provider]  memantine (NAMENDA) 10 MG tablet Take 10 mg by mouth 2 (two) times daily. 01/23/14  Yes [provider]  pravastatin (PRAVACHOL) 40 MG tablet Take 40 mg by mouth at bedtime. 05/06/14  Yes [provider]  simethicone (MYLICON) 481 MG chewable tablet Chew 125 mg by mouth at bedtime.   Yes [provider]  SYNTHROID 50 MCG tablet Take 50 mg by mouth daily. 05/23/14  Yes [provider]  zolpidem (AMBIEN) 10 MG tablet Take 10 mg by mouth at bedtime as needed for sleep.   Yes [provider]    Physical Exam: Vitals:   10/21/16 2046 10/21/16 2050 10/21/16 2115 10/21/16 2215  BP:  (!) 153/63 (!) 145/66 (!) 130/50  Pulse:  86 85 89  Resp:  18 16 14   Temp:  98 F (36.7 C)    SpO2:  92% 92% 98%  Weight: 63.5 kg (140 lb)     Height: 5\' 4"  (1.626 m)         Constitutional: No respiratory distress, calm, appears uncomfortable.  Eyes: PERTLA, lids and conjunctivae normal ENMT: Mucous membranes are moist. Posterior pharynx clear of any exudate or lesions.   Neck: normal, supple, no masses, no thyromegaly Respiratory: clear to auscultation bilaterally, no wheezing, no crackles. Normal respiratory effort.  Cardiovascular: S1 & S2 heard, regular rate and rhythm. No extremity edema. 2+ pedal pulses. No significant JVD. Abdomen: No distension, no tenderness, no masses palpated. Bowel sounds active.  Musculoskeletal: no clubbing / cyanosis. Right hip exquisitely tender. No gross hip deformity noted, no red/hot/swollen joint.   Skin: no significant rashes, lesions, ulcers. Warm, dry, well-perfused. Poor turgor.  Neurologic: No gross facial asymmetry. Sensation  intact in bilateral feet. Plantar- and dorsiflexion 5/5 bilaterally.  Psychiatric: Alert. Minimal verbal response. Makes eye contact and gives "yes" or "no" answers. Calm and cooperative.     Labs on Admission: I have personally reviewed following labs and imaging studies  CBC:  Recent Labs Lab 10/21/16 2111  WBC 12.8*  NEUTROABS 9.2*  HGB 12.4  HCT 38.3  MCV 93.6  PLT 856   Basic Metabolic Panel:  Recent Labs Lab 10/21/16 2111  NA 138  K 3.6  CL 105  CO2 22  GLUCOSE 109*  BUN 13  CREATININE 0.80  CALCIUM 9.2   GFR: Estimated Creatinine Clearance: 42.8 mL/min (by C-G formula based on SCr of 0.8 mg/dL). Liver Function Tests: No results for input(s): AST, ALT, ALKPHOS, BILITOT, PROT, ALBUMIN in the last 168 hours. No results for input(s): LIPASE, AMYLASE in the last 168 hours. No results for input(s): AMMONIA in the last 168 hours. Coagulation Profile:  Recent Labs Lab 10/21/16 2111  INR 0.94   Cardiac Enzymes: No results for input(s): CKTOTAL, CKMB, CKMBINDEX, TROPONINI in the last 168 hours. BNP (last 3 results) No results for input(s): PROBNP in the last 8760 hours. HbA1C: No results for input(s): HGBA1C in the last 72 hours. CBG: No results for input(s): GLUCAP in the last 168 hours. Lipid Profile: No results for input(s): CHOL, HDL, LDLCALC, TRIG, CHOLHDL, LDLDIRECT in the last 72 hours. Thyroid Function Tests: No results for input(s): TSH, T4TOTAL, FREET4, T3FREE, THYROIDAB in the last 72 hours. Anemia Panel: No results for input(s): VITAMINB12, FOLATE, FERRITIN, TIBC, IRON, RETICCTPCT in the last 72 hours. Urine analysis:    Component Value Date/Time   COLORURINE STRAW (A) 09/20/2016 1816   APPEARANCEUR CLEAR 09/20/2016 1816   LABSPEC 1.006 09/20/2016 1816   PHURINE 6.0 09/20/2016 1816   GLUCOSEU NEGATIVE 09/20/2016 1816   HGBUR NEGATIVE 09/20/2016 1816   BILIRUBINUR NEGATIVE 09/20/2016 1816   KETONESUR NEGATIVE 09/20/2016 1816   PROTEINUR  NEGATIVE 09/20/2016 1816   UROBILINOGEN 0.2 06/01/2014 1550   NITRITE NEGATIVE 09/20/2016 1816   LEUKOCYTESUR SMALL (A) 09/20/2016 1816   Sepsis Labs: @LABRCNTIP (procalcitonin:4,lacticidven:4) )No results found for this or any previous visit (from the past 240 hour(s)).   Radiological Exams on Admission: Dg Hip Unilat With Pelvis 2-3 Views Right  Result Date: 10/21/2016 CLINICAL DATA:  [place] at home today.  RIGHT femur pain. EXAM: DG HIP (WITH OR WITHOUT PELVIS) 2-3V RIGHT; RIGHT FEMUR 2 VIEWS COMPARISON:  None. FINDINGS: Acute impacted RIGHT femoral neck fracture in alignment. Femoral heads are located. No dislocation. No destructive bony lesions. Osteopenia. Soft tissue planes are nonsuspicious. Mild vascular calcifications. IMPRESSION: Acute nondisplaced RIGHT femoral neck fracture.  No dislocation. Electronically Signed   By: Elon Alas M.D.   On: 10/21/2016 22:10   Dg Femur Min 2 Views Right  Result Date: 10/21/2016 CLINICAL DATA:  [place] at home today.  RIGHT femur pain. EXAM: DG HIP (WITH OR WITHOUT PELVIS) 2-3V RIGHT; RIGHT FEMUR 2 VIEWS COMPARISON:  None. FINDINGS: Acute impacted RIGHT femoral neck fracture in alignment. Femoral heads are located. No dislocation. No destructive bony lesions. Osteopenia. Soft tissue planes are nonsuspicious. Mild vascular calcifications. IMPRESSION: Acute nondisplaced RIGHT femoral neck fracture.  No dislocation. Electronically Signed   By: Elon Alas M.D.   On: 10/21/2016 22:10    EKG: Independently reviewed. Sinus tachycardia (rate 105), PAC's, diffuse ST-depressions not significantly changed from prior.   Assessment/Plan  1. Right femoral neck fracture  - Pt presents following a ground-level mechanical fall at home onto her right side with no head-strike or LOC  - She had immediate and severe pain at the right hip and was unable to bear weight   - Radiographs reveal acute non-displaced right femoral neck fracture without dislocation;  no other significant injuries are evident  - Orthopedic surgery is consulting and much appreciated - Based on the available data, Patricia Shepherd presents an estimated 2.5% risk probability for perioperative MI or cardiac arrest per Melburn Hake al  - She takes ASA 325 mg daily, last dose 10/21/16, will hold  - Type and screen performed  - Supportive care with pain-control, gentle IVF hydration, blood pressure and glucose mgmt   2. Alzheimer dementia  - Advanced, progressing as expected  - Continue Namenda and Aricept - Family counseled regarding possibility of hospital delirium and agitation   3. History of CVA's  - No evidence for acute CVA  - Managed with statin and ASA 325 mg daily  - Continue  statin, hold ASA preoperatively    4. Chronic diastolic CHF, moderate aortic insufficiency  - Appears slightly hypovolemic on admission and will be provided NS infusion overnight  - TTE (06/02/14) with EF 65-70%, mild LVH, no WMA's, grade 2 diastolic dysfunction, moderate LAE, moderate AR, and mild MR - She has never had volume issues; no diuretics at home; breathing is unlabored and cxr clear on admission - Follow I/O's, continue NS infusion while NPO    5. Hypertension  - BP at goal  - Hydralazine IVP's available prn    6. Hypothyroidism  - Continue Synthroid   7. Leukocytosis  - Likely reactive to the acute hip fracture  - No fever or apparent infectious process; CXR clear and no respiratory s/s - UA still pending; no lower abd tenderness - Follow-up UA, culture if febrile     DVT prophylaxis: SCD's; per ortho  Code Status: DNR, Full for surgery Family Communication: Daughters updated at bedside Disposition Plan: Admit to med-surg Consults called: Orthopedic surgery Admission status: Inpatient   Vianne Bulls, MD [facility] Pager [phone]  If 7PM-7AM, please contact night-coverage [email or web address] Password [facility]  10/21/2016, 11:48 PM

## 2016-10-21 NOTE — ED Notes (Signed)
ED Provider at bedside. 

## 2016-10-21 NOTE — ED Notes (Signed)
Pt calling out saying " my leg hurts".

## 2016-10-21 NOTE — ED Provider Notes (Signed)
[facility] DEPT Provider Note   CSN: [id number] Arrival date & time: 10/21/16  2043     History   Chief Complaint Chief Complaint  Patient presents with  . Fall  . Hip Pain    HPI Patricia Shepherd is a 83 y.o. female.  Level V Caveat: Dementia.   Patricia Shepherd is a 83 y.o. Female with a history of dementia and CVA who presents to the emergency department via EMS after a trip and fall while going to the bathroom today. Patient was being assisted by family member to the bathroom and she tripped falling onto the caregivers chest and landing on her hip. She did not hit her head on the ground. Patient has been complaining of left hip and leg pain since the fall. No treatments attempted prior to arrival. She is not on anticoagulants. She has a history of dementia and a previous stroke and has some deficits to her left arm chronically. She has dementia and is unable to cooperate with history. She only complains of right hip pain.   The history is provided by a relative, a caregiver and medical records. No language interpreter was used.  Fall   Hip Pain     Past Medical History:  Diagnosis Date  . Anxiety   . Arthritis   . Dementia   . Hiatal hernia   . Hypertension   . Peripheral vascular disease ([place])   . Skin cancer 06/13/2016   left nostril- basal cell  . Stroke ([place])   . Syncope   . Thyroid disease     Patient Active Problem List   Diagnosis Date Noted  . Closed right hip fracture, initial encounter ([place]) 10/21/2016  . Moderate aortic valve insufficiency 10/21/2016  . Chronic diastolic CHF (congestive heart failure) ([place]) 10/21/2016  . Leukocytosis 10/21/2016  . Essential hypertension 06/02/2014  . Hypothyroidism 06/02/2014  . History of stroke 06/02/2014  . Dementia 06/01/2014    History reviewed. No pertinent surgical history.  OB History    No data available       Home Medications    Prior to Admission medications   Medication Sig Start Date End  Date Taking? Authorizing Provider  acetaminophen (TYLENOL) 500 MG tablet Take 500-1,000 mg by mouth at bedtime. Takes 2 tablets in the morning, 1 tablet at lunch and 2 tablets at bedtime   Yes [provider]  aspirin 325 MG tablet Take 1 tablet (325 mg total) by mouth daily. 06/02/14  Yes Black, Lezlie Octave, NP  cetirizine (ZYRTEC) 10 MG tablet Take 5 mg by mouth at bedtime.   Yes [provider]  docusate sodium (COLACE) 100 MG capsule Take 100 mg by mouth See admin instructions. Every other day   Yes [provider]  donepezil (ARICEPT) 10 MG tablet Take 5 mg by mouth at bedtime. 04/10/14  Yes [provider]  LORazepam (ATIVAN) 0.5 MG tablet 0.5 mg 2 (two) times daily.  05/30/14  Yes [provider]  memantine (NAMENDA) 10 MG tablet Take 10 mg by mouth 2 (two) times daily. 01/23/14  Yes [provider]  pravastatin (PRAVACHOL) 40 MG tablet Take 40 mg by mouth at bedtime. 05/06/14  Yes [provider]  simethicone (MYLICON) 166 MG chewable tablet Chew 125 mg by mouth at bedtime.   Yes [provider]  SYNTHROID 50 MCG tablet Take 50 mg by mouth daily. 05/23/14  Yes [provider]  zolpidem (AMBIEN) 10 MG tablet Take 10 mg by  mouth at bedtime as needed for sleep.   Yes [provider]    Family History Family History  Problem Relation Age of Onset  . Brain cancer Father   . Skin cancer Daughter     Social History Social History  Substance Use Topics  . Smoking status: Never Smoker  . Smokeless tobacco: Never Used  . Alcohol use No     Allergies   Bee venom   Review of Systems Review of Systems  Unable to perform ROS: Dementia  Musculoskeletal: Positive for arthralgias.     Physical Exam Updated Vital Signs BP (!) 151/74   Pulse 98   Temp 98 F (36.7 C)   Resp (!) 27   Ht 5\' 4"  (1.626 m)   Wt 63.5 kg (140 lb)   SpO2 100%   BMI 24.03 kg/m   Physical Exam  Constitutional: She appears  well-developed and well-nourished. No distress.  Nontoxic appearing.  HENT:  Head: Normocephalic and atraumatic.  Right Ear: External ear normal.  Left Ear: External ear normal.  Mouth/Throat: Oropharynx is clear and moist.  No visible or palpated signs of head injury or trauma.  Eyes: Conjunctivae are normal. Pupils are equal, round, and reactive to light. Right eye exhibits no discharge. Left eye exhibits no discharge.  Neck: Neck supple.  Cardiovascular: Normal rate, regular rhythm, normal heart sounds and intact distal pulses.   Bilateral radial, posterior tibialis and dorsalis pedis pulses are intact.    Pulmonary/Chest: Effort normal and breath sounds normal. No respiratory distress. She has no wheezes. She has no rales.  Lungs are clear to ascultation bilaterally. Symmetric chest expansion bilaterally. No increased work of breathing. No rales or rhonchi.    Abdominal: Soft. There is no tenderness.  Musculoskeletal: She exhibits tenderness. She exhibits no edema or deformity.  Tenderness overlying her right hip and right upper leg. No rotation or shortening of her right leg. Pain at her right hip with manipulation of her right lower leg. No midline neck or back tenderness to palpation. No knee or ankle tenderness to palpation bilaterally.   Lymphadenopathy:    She has no cervical adenopathy.  Neurological: She is alert. Coordination normal.  Alert and pleasantly demented.   Skin: Skin is warm and dry. Capillary refill takes less than 2 seconds. No rash noted. She is not diaphoretic. No erythema. No pallor.  Psychiatric: She has a normal mood and affect. Her behavior is normal.  Nursing note and vitals reviewed.    ED Treatments / Results  Labs (all labs ordered are listed, but only abnormal results are displayed) Labs Reviewed  BASIC METABOLIC PANEL - Abnormal; Notable for the following:       Result Value   Glucose, Bld 109 (*)    All other components within normal limits    CBC WITH DIFFERENTIAL/PLATELET - Abnormal; Notable for the following:    WBC 12.8 (*)    Neutro Abs 9.2 (*)    All other components within normal limits  PROTIME-INR  URINALYSIS, ROUTINE W REFLEX MICROSCOPIC  VITAMIN D 25 HYDROXY (VIT D DEFICIENCY, FRACTURES)  CBC  BASIC METABOLIC PANEL  TYPE AND SCREEN    EKG  EKG Interpretation None       Radiology Dg Chest Port 1 View  Result Date: 10/21/2016 CLINICAL DATA:  Preop hip fracture. EXAM: PORTABLE CHEST 1 VIEW COMPARISON:  Frontal and lateral views 09/20/2016 FINDINGS: The cardiomediastinal contours are stable from prior exam. There is atherosclerosis of the aortic arch.  Stable left basilar scarring. Pulmonary vasculature is normal. No consolidation, pleural effusion, or pneumothorax. No acute osseous abnormalities are seen. Mild compression deformity at the thoracic vertebra not well seen currently. IMPRESSION: 1. No acute abnormality. 2. Left basilar scarring. 3. Thoracic aortic atherosclerosis. Electronically Signed   By: Jeb Levering M.D.   On: 10/21/2016 23:50   Dg Hip Unilat With Pelvis 2-3 Views Right  Result Date: 10/21/2016 CLINICAL DATA:  [place] at home today.  RIGHT femur pain. EXAM: DG HIP (WITH OR WITHOUT PELVIS) 2-3V RIGHT; RIGHT FEMUR 2 VIEWS COMPARISON:  None. FINDINGS: Acute impacted RIGHT femoral neck fracture in alignment. Femoral heads are located. No dislocation. No destructive bony lesions. Osteopenia. Soft tissue planes are nonsuspicious. Mild vascular calcifications. IMPRESSION: Acute nondisplaced RIGHT femoral neck fracture.  No dislocation. Electronically Signed   By: Elon Alas M.D.   On: 10/21/2016 22:10   Dg Femur Min 2 Views Right  Result Date: 10/21/2016 CLINICAL DATA:  [place] at home today.  RIGHT femur pain. EXAM: DG HIP (WITH OR WITHOUT PELVIS) 2-3V RIGHT; RIGHT FEMUR 2 VIEWS COMPARISON:  None. FINDINGS: Acute impacted RIGHT femoral neck fracture in alignment. Femoral heads are located. No  dislocation. No destructive bony lesions. Osteopenia. Soft tissue planes are nonsuspicious. Mild vascular calcifications. IMPRESSION: Acute nondisplaced RIGHT femoral neck fracture.  No dislocation. Electronically Signed   By: Elon Alas M.D.   On: 10/21/2016 22:10    Procedures Procedures (including critical care time)  Medications Ordered in ED Medications  0.9 %  sodium chloride infusion (not administered)  zolpidem (AMBIEN) tablet 5 mg (not administered)  loratadine (CLARITIN) tablet 10 mg (not administered)  donepezil (ARICEPT) tablet 5 mg (not administered)  LORazepam (ATIVAN) tablet 0.5 mg (not administered)  memantine (NAMENDA) tablet 10 mg (not administered)  pravastatin (PRAVACHOL) tablet 40 mg (not administered)  simethicone (MYLICON) chewable tablet 80 mg (not administered)  levothyroxine (SYNTHROID, LEVOTHROID) tablet 50 mcg (not administered)  HYDROcodone-acetaminophen (NORCO/VICODIN) 5-325 MG per tablet 1-2 tablet (not administered)  morphine 4 MG/ML injection 1 mg (not administered)  docusate sodium (COLACE) capsule 100 mg (not administered)  polyethylene glycol (MIRALAX / GLYCOLAX) packet 17 g (not administered)  bisacodyl (DULCOLAX) EC tablet 5 mg (not administered)  hydrALAZINE (APRESOLINE) injection 10 mg (not administered)  fentaNYL (SUBLIMAZE) injection 50 mcg (50 mcg Intravenous Given 10/21/16 2220)     Initial Impression / Assessment and Plan / ED Course  I have reviewed the triage vital signs and the nursing notes.  Pertinent labs & imaging results that were available during my care of the patient were reviewed by me and considered in my medical decision making (see chart for details).    This is a 83 y.o. Female with a history of dementia and CVA who presents to the emergency department via EMS after a trip and fall while going to the bathroom today. Patient was being assisted by family member to the bathroom and she tripped falling onto the caregivers  chest and landing on her hip. She did not hit her head on the ground. Patient has been complaining of left hip and leg pain since the fall. No treatments attempted prior to arrival. She is not on anticoagulants. On exam patient is afebrile and nontoxic appearing. She is tenderness to her right lateral hip. Pain with manipulation of her lower leg at her right hip. She is neurovascularly intact. X-rays reveal an acute nondisplaced right femoral neck fracture. No dislocation. I consulted with orthopedic surgeon Dr. Ninfa Linden who would  like the patient to be admitted by the hospitalist service for likely repair tomorrow afternoon. Nothing by mouth after midnight. Family agrees with plan for admission and surgical repair tomorrow. I consulted with Dr. Myna Hidalgo from [facility] who accepted the patient for admission.   This patient was discussed with and evaluated by Dr. Laverta Baltimore who agrees with assessment and plan.   Final Clinical Impressions(s) / ED Diagnoses   Final diagnoses:  Preoperative testing  Closed right hip fracture, initial encounter [facility])    New Prescriptions New Prescriptions   No medications on file     Waynetta Pean, Hershal Coria 10/22/16 0037    Margette Fast, MD 10/22/16 1416

## 2016-10-22 ENCOUNTER — Inpatient Hospital Stay (HOSPITAL_COMMUNITY): Payer: Medicare Other | Admitting: Certified Registered Nurse Anesthetist

## 2016-10-22 ENCOUNTER — Inpatient Hospital Stay (HOSPITAL_COMMUNITY): Payer: Medicare Other

## 2016-10-22 ENCOUNTER — Encounter (HOSPITAL_COMMUNITY)
Admission: EM | Disposition: A | Discharge status: Skilled Nursing Facility | Payer: Self-pay | Source: Home / Self Care | Attending: Family Medicine

## 2016-10-22 ENCOUNTER — Encounter (HOSPITAL_COMMUNITY): Payer: Self-pay | Admitting: Certified Registered Nurse Anesthetist

## 2016-10-22 DIAGNOSIS — M12551 Traumatic arthropathy, right hip: Secondary | ICD-10-CM

## 2016-10-22 DIAGNOSIS — S72001A Fracture of unspecified part of neck of right femur, initial encounter for closed fracture: Secondary | ICD-10-CM

## 2016-10-22 HISTORY — PX: ANTERIOR APPROACH HEMI HIP ARTHROPLASTY: SHX6690

## 2016-10-22 LAB — URINALYSIS, ROUTINE W REFLEX MICROSCOPIC
Bilirubin Urine: NEGATIVE
Glucose, UA: NEGATIVE mg/dL
Hgb urine dipstick: NEGATIVE
Ketones, ur: NEGATIVE mg/dL
Leukocytes, UA: NEGATIVE
NITRITE: NEGATIVE
PH: 6 (ref 5.0–8.0)
Protein, ur: NEGATIVE mg/dL
SPECIFIC GRAVITY, URINE: 1.013 (ref 1.005–1.030)

## 2016-10-22 LAB — CBC
HCT: 38.5 % (ref 36.0–46.0)
Hemoglobin: 12.6 g/dL (ref 12.0–15.0)
MCH: 30.7 pg (ref 26.0–34.0)
MCHC: 32.7 g/dL (ref 30.0–36.0)
MCV: 93.9 fL (ref 78.0–100.0)
PLATELETS: 217 10*3/uL (ref 150–400)
RBC: 4.1 MIL/uL (ref 3.87–5.11)
RDW: 14.1 % (ref 11.5–15.5)
WBC: 19 10*3/uL — ABNORMAL HIGH (ref 4.0–10.5)

## 2016-10-22 LAB — TYPE AND SCREEN
ABO/RH(D): A POS
Antibody Screen: NEGATIVE

## 2016-10-22 LAB — BASIC METABOLIC PANEL
Anion gap: 10 (ref 5–15)
BUN: 14 mg/dL (ref 6–20)
CO2: 22 mmol/L (ref 22–32)
Calcium: 9.1 mg/dL (ref 8.9–10.3)
Chloride: 105 mmol/L (ref 101–111)
Creatinine, Ser: 0.78 mg/dL (ref 0.44–1.00)
GFR calc Af Amer: >60 mL/min (ref >60)
GFR calc non Af Amer: >60 mL/min (ref >60)
Glucose, Bld: 162 mg/dL — ABNORMAL HIGH (ref 65–99)
Potassium: 3.7 mmol/L (ref 3.5–5.1)
Sodium: 137 mmol/L (ref 135–145)

## 2016-10-22 LAB — SURGICAL PCR SCREEN
MRSA, PCR: NEGATIVE
Staphylococcus aureus: POSITIVE — AB

## 2016-10-22 LAB — ABO/RH: ABO/RH(D): A POS

## 2016-10-22 SURGERY — HEMIARTHROPLASTY, HIP, DIRECT ANTERIOR APPROACH, FOR FRACTURE
Anesthesia: Spinal | Site: Hip | Laterality: Right

## 2016-10-22 MED ORDER — MENTHOL 3 MG MT LOZG: 1.0000 | LOZENGE | OROMUCOSAL | Status: DC | PRN

## 2016-10-22 MED ORDER — TRANEXAMIC ACID 1000 MG/10ML IV SOLN
2000.0000 mg | INTRAVENOUS | Status: AC
  Administered 2016-10-22: 2000 mg via TOPICAL
  Filled 2016-10-22: qty 20

## 2016-10-22 MED ORDER — POVIDONE-IODINE 10 % EX SWAB: 2.0000 "application " | Freq: Once | CUTANEOUS | Status: DC

## 2016-10-22 MED ORDER — PHENYLEPHRINE 40 MCG/ML (10ML) SYRINGE FOR IV PUSH (FOR BLOOD PRESSURE SUPPORT)
PREFILLED_SYRINGE | INTRAVENOUS | Status: AC
  Filled 2016-10-22: qty 10

## 2016-10-22 MED ORDER — ALUM & MAG HYDROXIDE-SIMETH 200-200-20 MG/5ML PO SUSP
30.0000 mL | ORAL | Status: DC | PRN
  Administered 2016-10-23: 30 mL via ORAL
  Filled 2016-10-22: qty 30

## 2016-10-22 MED ORDER — ONDANSETRON HCL 4 MG PO TABS
4.0000 mg | ORAL_TABLET | Freq: Four times a day (QID) | ORAL | Status: DC | PRN
Start: 2016-10-22 — End: 2016-10-25

## 2016-10-22 MED ORDER — ONDANSETRON HCL 4 MG/2ML IJ SOLN: 4.0000 mg | Freq: Four times a day (QID) | INTRAMUSCULAR | Status: DC | PRN

## 2016-10-22 MED ORDER — LORAZEPAM 2 MG/ML IJ SOLN
INTRAMUSCULAR | Status: AC
  Administered 2016-10-22: 0.5 mg
  Filled 2016-10-22: qty 1

## 2016-10-22 MED ORDER — SODIUM CHLORIDE 0.9 % IR SOLN
Status: DC | PRN
  Administered 2016-10-22: 3000 mL

## 2016-10-22 MED ORDER — ONDANSETRON HCL 4 MG/2ML IJ SOLN
INTRAMUSCULAR | Status: DC | PRN
  Administered 2016-10-22: 4 mg via INTRAVENOUS

## 2016-10-22 MED ORDER — ACETAMINOPHEN 650 MG RE SUPP: 650.0000 mg | Freq: Four times a day (QID) | RECTAL | Status: DC | PRN

## 2016-10-22 MED ORDER — TRANEXAMIC ACID 1000 MG/10ML IV SOLN: 1000.0000 mg | INTRAVENOUS | Status: DC

## 2016-10-22 MED ORDER — CEFAZOLIN SODIUM-DEXTROSE 2-4 GM/100ML-% IV SOLN
2.0000 g | Freq: Four times a day (QID) | INTRAVENOUS | Status: AC
  Administered 2016-10-22 – 2016-10-23 (×3): 2 g via INTRAVENOUS
  Filled 2016-10-22 (×3): qty 100

## 2016-10-22 MED ORDER — ACETAMINOPHEN 325 MG PO TABS
650.0000 mg | ORAL_TABLET | Freq: Four times a day (QID) | ORAL | Status: DC | PRN
  Administered 2016-10-22: 650 mg via ORAL
  Filled 2016-10-22 (×2): qty 2

## 2016-10-22 MED ORDER — LIDOCAINE 2% (20 MG/ML) 5 ML SYRINGE
INTRAMUSCULAR | Status: AC
  Filled 2016-10-22: qty 5

## 2016-10-22 MED ORDER — PHENYLEPHRINE 40 MCG/ML (10ML) SYRINGE FOR IV PUSH (FOR BLOOD PRESSURE SUPPORT)
PREFILLED_SYRINGE | INTRAVENOUS | Status: DC | PRN
  Administered 2016-10-22: 80 ug via INTRAVENOUS
  Administered 2016-10-22: 120 ug via INTRAVENOUS
  Administered 2016-10-22: 80 ug via INTRAVENOUS

## 2016-10-22 MED ORDER — PHENOL 1.4 % MT LIQD: 1.0000 | OROMUCOSAL | Status: DC | PRN

## 2016-10-22 MED ORDER — MORPHINE SULFATE (PF) 4 MG/ML IV SOLN
0.5000 mg | INTRAVENOUS | Status: DC | PRN
  Administered 2016-10-25: 0.52 mg via INTRAVENOUS
  Filled 2016-10-22 (×2): qty 1

## 2016-10-22 MED ORDER — CEFAZOLIN SODIUM-DEXTROSE 2-4 GM/100ML-% IV SOLN: 2.0000 g | INTRAVENOUS | Status: DC

## 2016-10-22 MED ORDER — METOCLOPRAMIDE HCL 5 MG PO TABS: 5.0000 mg | ORAL_TABLET | Freq: Three times a day (TID) | ORAL | Status: DC | PRN

## 2016-10-22 MED ORDER — HYDROCODONE-ACETAMINOPHEN 7.5-325 MG PO TABS: 1.0000 | ORAL_TABLET | Freq: Four times a day (QID) | ORAL | 0 refills | Status: AC | PRN

## 2016-10-22 MED ORDER — DEXAMETHASONE SODIUM PHOSPHATE 10 MG/ML IJ SOLN
INTRAMUSCULAR | Status: DC | PRN
  Administered 2016-10-22: 10 mg via INTRAVENOUS

## 2016-10-22 MED ORDER — LACTATED RINGERS IV SOLN
INTRAVENOUS | Status: DC | PRN
  Administered 2016-10-22 (×2): via INTRAVENOUS

## 2016-10-22 MED ORDER — PROPOFOL 500 MG/50ML IV EMUL
INTRAVENOUS | Status: DC | PRN
  Administered 2016-10-22: 75 ug/kg/min via INTRAVENOUS

## 2016-10-22 MED ORDER — ACETAMINOPHEN 325 MG PO TABS
650.0000 mg | ORAL_TABLET | Freq: Four times a day (QID) | ORAL | Status: DC | PRN
  Administered 2016-10-23: 650 mg via ORAL

## 2016-10-22 MED ORDER — PROPOFOL 10 MG/ML IV BOLUS
INTRAVENOUS | Status: AC
  Filled 2016-10-22: qty 20

## 2016-10-22 MED ORDER — METHOCARBAMOL 500 MG PO TABS: 500.0000 mg | ORAL_TABLET | Freq: Four times a day (QID) | ORAL | Status: DC | PRN

## 2016-10-22 MED ORDER — OXYCODONE HCL 5 MG PO TABS
5.0000 mg | ORAL_TABLET | ORAL | Status: DC | PRN
  Administered 2016-10-25 (×2): 5 mg via ORAL
  Filled 2016-10-22 (×2): qty 1

## 2016-10-22 MED ORDER — METHOCARBAMOL 1000 MG/10ML IJ SOLN
500.0000 mg | Freq: Four times a day (QID) | INTRAVENOUS | Status: DC | PRN
  Administered 2016-10-25: 500 mg via INTRAVENOUS
  Filled 2016-10-22 (×2): qty 5

## 2016-10-22 MED ORDER — ROCURONIUM BROMIDE 10 MG/ML (PF) SYRINGE
PREFILLED_SYRINGE | INTRAVENOUS | Status: AC
  Filled 2016-10-22: qty 5

## 2016-10-22 MED ORDER — FENTANYL CITRATE (PF) 100 MCG/2ML IJ SOLN
INTRAMUSCULAR | Status: DC | PRN
  Administered 2016-10-22: 25 ug via INTRAVENOUS

## 2016-10-22 MED ORDER — PROPOFOL 10 MG/ML IV BOLUS
INTRAVENOUS | Status: DC | PRN
  Administered 2016-10-22: 20 mg via INTRAVENOUS

## 2016-10-22 MED ORDER — 0.9 % SODIUM CHLORIDE (POUR BTL) OPTIME
TOPICAL | Status: DC | PRN
  Administered 2016-10-22: 1000 mL

## 2016-10-22 MED ORDER — ENOXAPARIN SODIUM 40 MG/0.4ML ~~LOC~~ SOLN
40.0000 mg | SUBCUTANEOUS | Status: DC
  Administered 2016-10-23 – 2016-10-25 (×3): 40 mg via SUBCUTANEOUS
  Filled 2016-10-22 (×3): qty 0.4

## 2016-10-22 MED ORDER — ONDANSETRON HCL 4 MG/2ML IJ SOLN
INTRAMUSCULAR | Status: AC
  Filled 2016-10-22: qty 2

## 2016-10-22 MED ORDER — EPHEDRINE SULFATE-NACL 50-0.9 MG/10ML-% IV SOSY
PREFILLED_SYRINGE | INTRAVENOUS | Status: DC | PRN
  Administered 2016-10-22 (×3): 5 mg via INTRAVENOUS

## 2016-10-22 MED ORDER — PHENYLEPHRINE HCL 10 MG/ML IJ SOLN
INTRAVENOUS | Status: DC | PRN
  Administered 2016-10-22: 50 ug/min via INTRAVENOUS

## 2016-10-22 MED ORDER — BUPIVACAINE HCL (PF) 0.75 % IJ SOLN
INTRAMUSCULAR | Status: DC | PRN
  Administered 2016-10-22: 1.5 mL via INTRATHECAL

## 2016-10-22 MED ORDER — HYDROCODONE-ACETAMINOPHEN 5-325 MG PO TABS: 1.0000 | ORAL_TABLET | Freq: Four times a day (QID) | ORAL | Status: DC | PRN

## 2016-10-22 MED ORDER — METOCLOPRAMIDE HCL 5 MG/ML IJ SOLN: 5.0000 mg | Freq: Three times a day (TID) | INTRAMUSCULAR | Status: DC | PRN

## 2016-10-22 MED ORDER — CEFAZOLIN SODIUM-DEXTROSE 2-3 GM-% IV SOLR
INTRAVENOUS | Status: DC | PRN
  Administered 2016-10-22: 2 g via INTRAVENOUS

## 2016-10-22 MED ORDER — CEFAZOLIN SODIUM 1 G IJ SOLR
INTRAMUSCULAR | Status: AC
Start: 2016-10-22 — End: 2016-10-22
  Filled 2016-10-22: qty 20

## 2016-10-22 MED ORDER — ENOXAPARIN SODIUM 40 MG/0.4ML ~~LOC~~ SOLN: 40.0000 mg | Freq: Every day | SUBCUTANEOUS | 0 refills | Status: AC

## 2016-10-22 MED ORDER — LORAZEPAM 2 MG/ML IJ SOLN: 0.5000 mg | INTRAMUSCULAR | Status: DC | PRN

## 2016-10-22 MED ORDER — FENTANYL CITRATE (PF) 250 MCG/5ML IJ SOLN
INTRAMUSCULAR | Status: AC
  Filled 2016-10-22: qty 5

## 2016-10-22 MED ORDER — SODIUM CHLORIDE 0.9 % IV SOLN
INTRAVENOUS | Status: DC
  Administered 2016-10-22 – 2016-10-23 (×2): via INTRAVENOUS

## 2016-10-22 MED ORDER — TRANEXAMIC ACID 1000 MG/10ML IV SOLN
1000.0000 mg | INTRAVENOUS | Status: AC
  Administered 2016-10-22: 1000 mg via INTRAVENOUS
  Filled 2016-10-22: qty 10

## 2016-10-22 SURGICAL SUPPLY — 44 items
BAG DECANTER FOR FLEXI CONT (MISCELLANEOUS) ×3 IMPLANT
CAPT HIP HEMI 2 ×3 IMPLANT
CELLS DAT CNTRL 66122 CELL SVR (MISCELLANEOUS) ×1 IMPLANT
COVER SURGICAL LIGHT HANDLE (MISCELLANEOUS) ×3 IMPLANT
DRAPE C-ARM 42X72 X-RAY (DRAPES) ×3 IMPLANT
DRAPE STERI IOBAN 125X83 (DRAPES) ×3 IMPLANT
DRAPE U-SHAPE 47X51 STRL (DRAPES) ×6 IMPLANT
DRSG AQUACEL AG ADV 3.5X10 (GAUZE/BANDAGES/DRESSINGS) ×3 IMPLANT
DURAPREP 26ML APPLICATOR (WOUND CARE) ×3 IMPLANT
ELECT BLADE 4.0 EZ CLEAN MEGAD (MISCELLANEOUS) ×3
ELECT REM PT RETURN 9FT ADLT (ELECTROSURGICAL) ×3
ELECTRODE BLDE 4.0 EZ CLN MEGD (MISCELLANEOUS) ×1 IMPLANT
ELECTRODE REM PT RTRN 9FT ADLT (ELECTROSURGICAL) ×1 IMPLANT
GLOVE ECLIPSE 7.0 STRL STRAW (GLOVE) ×18 IMPLANT
GLOVE INDICATOR 7.0 STRL GRN (GLOVE) ×15 IMPLANT
GLOVE SKINSENSE NS SZ7.5 (GLOVE) ×6
GLOVE SKINSENSE STRL SZ7.5 (GLOVE) ×3 IMPLANT
GLOVE SURG SYN 7.5  E (GLOVE) ×6
GLOVE SURG SYN 7.5 E (GLOVE) ×3 IMPLANT
GOWN SRG XL XLNG 56XLVL 4 (GOWN DISPOSABLE) ×1 IMPLANT
GOWN STRL NON-REIN XL XLG LVL4 (GOWN DISPOSABLE) ×2
GOWN STRL REUS W/ TWL LRG LVL3 (GOWN DISPOSABLE) ×4 IMPLANT
GOWN STRL REUS W/TWL LRG LVL3 (GOWN DISPOSABLE) ×8
HANDPIECE INTERPULSE COAX TIP (DISPOSABLE) ×2
HOOD PEEL AWAY FLYTE STAYCOOL (MISCELLANEOUS) ×6 IMPLANT
IV NS IRRIG 3000ML ARTHROMATIC (IV SOLUTION) ×3 IMPLANT
KIT BASIN OR (CUSTOM PROCEDURE TRAY) ×3 IMPLANT
MARKER SKIN DUAL TIP RULER LAB (MISCELLANEOUS) ×6 IMPLANT
PACK TOTAL JOINT (CUSTOM PROCEDURE TRAY) ×3 IMPLANT
PACK UNIVERSAL I (CUSTOM PROCEDURE TRAY) ×3 IMPLANT
RTRCTR WOUND ALEXIS 18CM MED (MISCELLANEOUS) ×3
SAW OSC TIP CART 19.5X105X1.3 (SAW) ×3 IMPLANT
SET HNDPC FAN SPRY TIP SCT (DISPOSABLE) ×1 IMPLANT
STAPLER VISISTAT 35W (STAPLE) ×3 IMPLANT
SUT ETHIBOND 2 V 37 (SUTURE) ×12 IMPLANT
SUT VIC AB 1 CT1 27 (SUTURE) ×2
SUT VIC AB 1 CT1 27XBRD ANBCTR (SUTURE) ×1 IMPLANT
SUT VIC AB 2-0 CT1 27 (SUTURE) ×2
SUT VIC AB 2-0 CT1 TAPERPNT 27 (SUTURE) ×1 IMPLANT
SUT VIC AB 2-0 SH 27 (SUTURE) ×2
SUT VIC AB 2-0 SH 27XBRD (SUTURE) ×1 IMPLANT
TOWEL OR 17X26 10 PK STRL BLUE (TOWEL DISPOSABLE) ×3 IMPLANT
TRAY CATH 16FR W/PLASTIC CATH (SET/KITS/TRAYS/PACK) ×3 IMPLANT
YANKAUER SUCT BULB TIP NO VENT (SUCTIONS) ×3 IMPLANT

## 2016-10-22 NOTE — Anesthesia Postprocedure Evaluation (Signed)
Anesthesia Post Note  Patient: TAMYRA [id number]  Procedure(s) Performed: Procedure(s) (LRB): ANTERIOR APPROACH HEMI HIP ARTHROPLASTY (Right)     Patient location during evaluation: PACU Anesthesia Type: Spinal Level of consciousness: awake and alert Pain management: pain level controlled Vital Signs Assessment: post-procedure vital signs reviewed and stable Respiratory status: spontaneous breathing and respiratory function stable Cardiovascular status: blood pressure returned to baseline and stable Postop Assessment: spinal receding Anesthetic complications: no    Last Vitals:  Vitals:   10/22/16 1600 10/22/16 1614  BP: 116/62 126/60  Pulse: 93 96  Resp: 15 13  Temp:  36.3 C    Last Pain:  Vitals:   10/22/16 0515  TempSrc:   PainSc: Asleep    LLE Motor Response: Purposeful movement;Responds to commands (10/22/16 1614) LLE Sensation: Numbness (spinal) (10/22/16 1614) RLE Motor Response: Purposeful movement;Responds to commands (10/22/16 1614) RLE Sensation: Numbness (spinal) (10/22/16 1614) L Sensory Level: L5-Outer lower leg, top of foot, great toe (10/22/16 1614) R Sensory Level: L5-Outer lower leg, top of foot, great toe (10/22/16 1614)  Malissa Slay,W. EDMOND

## 2016-10-22 NOTE — Op Note (Signed)
ANTERIOR APPROACH HEMI HIP ARTHROPLASTY  Procedure Note Patricia Shepherd   [id number]  Pre-op Diagnosis: RIGHT HIP FRACTURE     Post-op Diagnosis: same   Operative Procedures  1. Prosthetic replacement for femoral neck fracture. CPT [phone]  Personnel  Surgeon(s): Leandrew Koyanagi, MD   Anesthesia: spinal  Prosthesis: depuy Femur: corail KA 12 Head: 47 mm size: +1.5 Bearing Type: bipolar  Hip Hemiarthroplasty (Anterior Approach) Op Note:  After informed consent was obtained and the operative extremity marked in the holding area, the patient was brought back to the operating room and placed supine on the HANA table. Next, the operative extremity was prepped and draped in normal sterile fashion. Surgical timeout occurred verifying patient identification, surgical site, surgical procedure and administration of antibiotics.  A modified anterior Smith-Peterson approach to the hip was performed, using the interval between tensor fascia lata and sartorius.  Dissection was carried bluntly down onto the anterior hip capsule. The lateral femoral circumflex vessels were identified and coagulated. A capsulotomy was performed and the capsular flaps tagged for later repair.  Fluoroscopy was utilized to prepare for the femoral neck cut. The neck osteotomy was performed. The femoral head was removed and found a 47 mm head was the appropriate fit.    We then turned our attention to the femur.  After placing the femoral hook, the leg was taken to externally rotated, extended and adducted position taking care to perform soft tissue releases to allow for adequate mobilization of the femur. Soft tissue was cleared from the shoulder of the greater trochanter and the hook elevator used to improve exposure of the proximal femur. Sequential broaching performed up to a size 12. Trial neck and head were placed. The leg was brought back up to neutral and the construct reduced. The position and sizing of components, offset and  leg lengths were checked using fluoroscopy. Stability of the construct was checked in extension and external rotation without any subluxation or impingement of prosthesis. We dislocated the prosthesis, dropped the leg back into position, removed trial components, and irrigated copiously. The final stem and head was then placed, the leg brought back up, the system reduced and fluoroscopy used to verify positioning.  We irrigated, obtained hemostasis and closed the capsule using #2 ethibond suture.  The fascia was closed with #1 vicryl plus, the deep fat layer was closed with 0 vicryl, the subcutaneous layers closed with 2.0 Vicryl Plus and the skin closed with staples. A sterile dressing was applied. The patient was awakened in the operating room and taken to recovery in stable condition. All sponge, needle, and instrument counts were correct at the end of the case.   Position: supine  Complications: none.  Time Out: performed   Drains/Packing: none  Estimated blood loss: 150 cc  Returned to Recovery Room: in good condition.   Antibiotics: yes   Mechanical VTE (DVT) Prophylaxis: sequential compression devices, TED thigh-high  Chemical VTE (DVT) Prophylaxis: lovenox  Fluid Replacement: Crystalloid: see anesthesia record  Specimens Removed: 1 to pathology   Sponge and Instrument Count Correct? yes   PACU: portable radiograph - low AP   Admission: inpatient status, start PT & OT POD#1  Plan/RTC: Return in 2 weeks for staple removal. Return in 6 weeks to see MD.  Weight Bearing/Load Lower Extremity: full  Hip precautions: none Suture Removal: 10-14 days  Betadine to incision twice daily once dressing is removed on POD#7  N. Eduard Roux, MD [place] [phone] 3:01 PM  Implant Name Type Inv. Item Serial No. Manufacturer Lot No. LRB No. Used  BIPOLAR DEPUY 47MM - [id number] Hips BIPOLAR DEPUY 47MM  DEPUY SYNTHES [id number] Right 1  HIP BALL ARTICU DEPUY - [id number]  Hips HIP BALL ARTICU DEPUY  DEPUY SYNTHES [id number] Right 1  STEM CORAIL KA12 - [id number] Stem STEM CORAIL KA12   DEPUY SYNTHES [id number] Right 1

## 2016-10-22 NOTE — Progress Notes (Signed)
Dr. Wynetta Emery called due to patient having a seizure that  lasted for approximately 3-4 minutes. Order given for IV ativan and medication given. Daughter at bedside.

## 2016-10-22 NOTE — Progress Notes (Signed)
Initial Nutrition Assessment  DOCUMENTATION CODES:   Not applicable  INTERVENTION:    Advance diet as medically appropriate.  Add interventions accordingly.  NUTRITION DIAGNOSIS:   Increased nutrient needs related to  (hip fracture) as evidenced by estimated needs  GOAL:   Patient will meet greater than or equal to 90% of their needs  MONITOR:   Diet advancement, PO intake, Labs, Weight trends, I & O's  REASON FOR ASSESSMENT:   Consult Hip fracture protocol  ASSESSMENT:   83 yo Female who presented with right hip fracture s/p mechanical fall yesterday at home.  RD unable to obtain nutrition hx.  Pt currently in [facility] OR. No nutrition problems identified per chart review. Swallow evaluation pending per Speech Path. Nutrient needs increased with post-op healing. Labs and medications reviewed.  Unable to complete Nutrition-Focused physical exam at this time.   Diet Order:  Diet NPO time specified Except for: Ice Chips, Sips with Meds  Skin:  Reviewed, no issues  Last BM:  PTA  Height:   Ht Readings from Last 1 Encounters:  10/21/16 5\' 4"  (1.626 m)    Weight:   Wt Readings from Last 1 Encounters:  10/21/16 140 lb (63.5 kg)    Ideal Body Weight:  54.5 kg  BMI:  Body mass index is 24.03 kg/m.  Estimated Nutritional Needs:   Kcal:  1500-1700  Protein:  75-85 gm  Fluid:  1.5-1.7 L  EDUCATION NEEDS:   No education needs identified at this time  Arthur Holms, RD, LDN Pager #: [phone] After-Hours Pager #: [phone]

## 2016-10-22 NOTE — Anesthesia Procedure Notes (Signed)
Spinal  Patient location during procedure: OR Start time: 10/22/2016 1:30 PM End time: 10/22/2016 1:42 PM Staffing Anesthesiologist: Rica Koyanagi Performed: anesthesiologist  Preanesthetic Checklist Completed: patient identified, site marked, surgical consent, pre-op evaluation, timeout performed, IV checked, risks and benefits discussed and monitors and equipment checked Spinal Block Patient position: right lateral decubitus Prep: ChloraPrep Patient monitoring: cardiac monitor, continuous pulse ox and blood pressure Approach: right paramedian Location: L3-4 Injection technique: single-shot Needle Needle type: Quincke  Needle gauge: 22 G Assessment Sensory level: T8

## 2016-10-22 NOTE — Consult Note (Addendum)
ORTHOPAEDIC CONSULTATION  REQUESTING PHYSICIAN: Murlean Iba, MD  Chief Complaint: Right femoral neck hip fracture  HPI: Patricia Shepherd is a 83 y.o. female who presents with right hip fracture s/p mechanical fall yesterday at home.  She has advanced dementia.  The patient endorses severe pain in the right hip worse with any movement, better with immobilization.  Denies LOC/fever/chills/nausea/vomiting.  Walks with assistive devices walker.  Does live at home with daughters.  Denies LOC, neck pain, abd pain.  Has h/o CVA with left hemiparesis.  Stable cardiac comorbidities.  Ortho consulted.  Past Medical History:  Diagnosis Date  . Anxiety   . Arthritis   . Dementia   . Hiatal hernia   . Hypertension   . Peripheral vascular disease ([place])   . Skin cancer 06/13/2016   left nostril- basal cell  . Stroke ([place])   . Syncope   . Thyroid disease    History reviewed. No pertinent surgical history. Social History   Social History  . Marital status: Married    Spouse name: N/A  . Number of children: N/A  . Years of education: N/A   Social History Main Topics  . Smoking status: Never Smoker  . Smokeless tobacco: Never Used  . Alcohol use No  . Drug use: No  . Sexual activity: No   Other Topics Concern  . None   Social History Narrative  . None   Family History  Problem Relation Age of Onset  . Brain cancer Father   . Skin cancer Daughter    Allergies  Allergen Reactions  . Bee Venom Swelling   Prior to Admission medications   Medication Sig Start Date End Date Taking? Authorizing Provider  acetaminophen (TYLENOL) 500 MG tablet Take 500-1,000 mg by mouth at bedtime. Takes 2 tablets in the morning, 1 tablet at lunch and 2 tablets at bedtime   Yes [provider]  aspirin 325 MG tablet Take 1 tablet (325 mg total) by mouth daily. 06/02/14  Yes Black, Lezlie Octave, NP  cetirizine (ZYRTEC) 10 MG tablet Take 5 mg by mouth at bedtime.   Yes [provider]    docusate sodium (COLACE) 100 MG capsule Take 100 mg by mouth See admin instructions. Every other day   Yes [provider]  donepezil (ARICEPT) 10 MG tablet Take 5 mg by mouth at bedtime. 04/10/14  Yes [provider]  LORazepam (ATIVAN) 0.5 MG tablet 0.5 mg 2 (two) times daily.  05/30/14  Yes [provider]  memantine (NAMENDA) 10 MG tablet Take 10 mg by mouth 2 (two) times daily. 01/23/14  Yes [provider]  pravastatin (PRAVACHOL) 40 MG tablet Take 40 mg by mouth at bedtime. 05/06/14  Yes [provider]  simethicone (MYLICON) 427 MG chewable tablet Chew 125 mg by mouth at bedtime.   Yes [provider]  SYNTHROID 50 MCG tablet Take 50 mg by mouth daily. 05/23/14  Yes [provider]  zolpidem (AMBIEN) 10 MG tablet Take 10 mg by mouth at bedtime as needed for sleep.   Yes [provider]   Dg Chest Port 1 View  Result Date: 10/21/2016 CLINICAL DATA:  Preop hip fracture. EXAM: PORTABLE CHEST 1 VIEW COMPARISON:  Frontal and lateral views 09/20/2016 FINDINGS: The cardiomediastinal contours are stable from prior exam. There is atherosclerosis of the aortic arch. Stable left basilar scarring. Pulmonary vasculature is normal. No consolidation, pleural effusion, or pneumothorax. No acute osseous abnormalities are seen. Mild compression  deformity at the thoracic vertebra not well seen currently. IMPRESSION: 1. No acute abnormality. 2. Left basilar scarring. 3. Thoracic aortic atherosclerosis. Electronically Signed   By: Jeb Levering M.D.   On: 10/21/2016 23:50   Dg Hip Unilat With Pelvis 2-3 Views Right  Result Date: 10/21/2016 CLINICAL DATA:  [place] at home today.  RIGHT femur pain. EXAM: DG HIP (WITH OR WITHOUT PELVIS) 2-3V RIGHT; RIGHT FEMUR 2 VIEWS COMPARISON:  None. FINDINGS: Acute impacted RIGHT femoral neck fracture in alignment. Femoral heads are located. No dislocation. No destructive bony lesions. Osteopenia. Soft tissue  planes are nonsuspicious. Mild vascular calcifications. IMPRESSION: Acute nondisplaced RIGHT femoral neck fracture.  No dislocation. Electronically Signed   By: Elon Alas M.D.   On: 10/21/2016 22:10   Dg Femur Min 2 Views Right  Result Date: 10/21/2016 CLINICAL DATA:  [place] at home today.  RIGHT femur pain. EXAM: DG HIP (WITH OR WITHOUT PELVIS) 2-3V RIGHT; RIGHT FEMUR 2 VIEWS COMPARISON:  None. FINDINGS: Acute impacted RIGHT femoral neck fracture in alignment. Femoral heads are located. No dislocation. No destructive bony lesions. Osteopenia. Soft tissue planes are nonsuspicious. Mild vascular calcifications. IMPRESSION: Acute nondisplaced RIGHT femoral neck fracture.  No dislocation. Electronically Signed   By: Elon Alas M.D.   On: 10/21/2016 22:10    All pertinent xrays, MRI, CT independently reviewed and interpreted  Positive ROS: All other systems have been reviewed and were otherwise negative with the exception of those mentioned in the HPI and as above.  Physical Exam: General: no acute distress Cardiovascular: No pedal edema Respiratory: No cyanosis, no use of accessory musculature GI: No organomegaly, abdomen is soft and non-tender Skin: No lesions in the area of chief complaint Neurologic: Sensation intact distally Psychiatric: Patient has advanced dementia Lymphatic: No axillary or cervical lymphadenopathy  MUSCULOSKELETAL:  - pain with movement of the hip and extremity - skin intact - NVI distally - compartments soft  Assessment: Right femoral neck hip fracture Osteopenia/osteoporosis  Plan: - partial hip replacement is recommended, patient and family are aware of r/b/a and wish to proceed - consent obtained from daughter - medically optimized - cardiac comorbidities are stable and optimized - surgery is planned for this afternoon - family understand that 1 year mortality from hip fracture is 20-30%   Thank you for the consult and the opportunity to  see Ms. Patricia Shepherd Eduard Roux, MD [place] 8:22 AM

## 2016-10-22 NOTE — Transfer of Care (Signed)
Immediate Anesthesia Transfer of Care Note  Patient: Patricia Shepherd  Procedure(s) Performed: Procedure(s): ANTERIOR APPROACH HEMI HIP ARTHROPLASTY (Right)  Patient Location: PACU  Anesthesia Type:Spinal  Level of Consciousness: awake and alert   Airway & Oxygen Therapy: Patient Spontanous Breathing and Patient connected to face mask oxygen  Post-op Assessment: Report given to RN and Post -op Vital signs reviewed and stable  Post vital signs: Reviewed and stable  Last Vitals:  Vitals:   10/22/16 0211 10/22/16 0406  BP: (!) 151/54 139/62  Pulse: (!) 103 (!) 102  Resp: 18 20  Temp: 36.7 C 36.9 C   Pt on Neo gtt Last Pain:  Vitals:   10/22/16 0515  TempSrc:   PainSc: Asleep         Complications: No apparent anesthesia complications

## 2016-10-22 NOTE — Progress Notes (Signed)
Patient transferred to OR via bed with family at bedside. Report called to RN in the recovery room.

## 2016-10-22 NOTE — Anesthesia Preprocedure Evaluation (Addendum)
Anesthesia Evaluation  Patient identified by MRN, date of birth, ID band Patient awake    Reviewed: Allergy & Precautions, NPO status , Patient's Chart, lab work & pertinent test results  History of Anesthesia Complications Negative for: history of anesthetic complications  Airway Mallampati: II  TM Distance: >3 FB Neck ROM: Full    Dental  (+) Edentulous Upper, Edentulous Lower, Dental Advisory Given   Pulmonary    breath sounds clear to auscultation       Cardiovascular hypertension, + Peripheral Vascular Disease and +CHF  + Valvular Problems/Murmurs AI  Rhythm:Regular Rate:Normal     Neuro/Psych  Neuromuscular disease CVA, Residual Symptoms    GI/Hepatic hiatal hernia,   Endo/Other  Hypothyroidism   Renal/GU      Musculoskeletal  (+) Arthritis ,   Abdominal   Peds  Hematology   Anesthesia Other Findings   Reproductive/Obstetrics                            Anesthesia Physical Anesthesia Plan  ASA: III  Anesthesia Plan: Spinal   Post-op Pain Management:    Induction:   PONV Risk Score and Plan: 2 and Ondansetron and Dexamethasone  Airway Management Planned: Natural Airway  Additional Equipment:   Intra-op Plan:   Post-operative Plan:   Informed Consent: I have reviewed the patients History and Physical, chart, labs and discussed the procedure including the risks, benefits and alternatives for the proposed anesthesia with the patient or authorized representative who has indicated his/her understanding and acceptance.   Dental advisory given  Plan Discussed with: CRNA  Anesthesia Plan Comments:         Anesthesia Quick Evaluation

## 2016-10-22 NOTE — Anesthesia Procedure Notes (Signed)
Procedure Name: MAC Date/Time: 10/22/2016 1:45 PM Performed by: Garrison [place] T Pre-anesthesia Checklist: Patient identified, Emergency Drugs available, Suction available and Patient being monitored Patient Re-evaluated:Patient Re-evaluated prior to inductionOxygen Delivery Method: Simple face mask Preoxygenation: Pre-oxygenation with 100% oxygen Intubation Type: IV induction Placement Confirmation: positive ETCO2 and breath sounds checked- equal and bilateral Dental Injury: Teeth and Oropharynx as per pre-operative assessment

## 2016-10-22 NOTE — Progress Notes (Signed)
PROGRESS NOTE    Patricia Shepherd  JFH:[id number]  DOB: [birth year]  DOA: 10/21/2016 PCP: Dione Housekeeper, MD   Brief Admission Hx: Patricia Shepherd is a 83 y.o. female with medical history significant for history of ischemic strokes, Alzheimer dementia, hypothyroidism, hypertension, chronic diastolic CHF, and moderate aortic regurgitation, now presenting to the emergency department with severe right hip pain after a fall at home.  MDM/Assessment & Plan:   1. Right femoral neck fracture   - Orthopedic surgery is consulting and planning for surgery later today - Based on the available data, Ms. Legler presents an estimated 2.5% risk probability for perioperative MI or cardiac arrest per Melburn Hake al  - She takes ASA 325 mg daily, last dose 10/21/16, will hold  - Type and screen performed  - Supportive care with pain-control, gentle IVF hydration, blood pressure and glucose mgmt   2. Alzheimer dementia  - Advanced, progressing as expected  - Continue Namenda and Aricept - Family counseled regarding possibility of hospital delirium and agitation   3. History of CVA's  - No evidence for acute CVA  - Managed with statin and ASA 325 mg daily  - Continue statin, hold ASA preoperatively    4. Chronic diastolic CHF, moderate aortic insufficiency  - Appears slightly hypovolemic on admission and will be provided NS infusion overnight  - TTE (06/02/14) with EF 65-70%, mild LVH, no WMA's, grade 2 diastolic dysfunction, moderate LAE, moderate AR, and mild MR - She has never had volume issues; no diuretics at home; breathing is unlabored and cxr clear on admission - Follow I/O's, continue NS infusion while NPO    5. Hypertension  - BP at goal  - Hydralazine IVP's available prn    6. Hypothyroidism  - Continue Synthroid   7. Leukocytosis  - Likely reactive to the acute hip fracture  - No fever or apparent infectious process; CXR clear and no respiratory s/s - UA negative for  infection   DVT prophylaxis: SCD's; per ortho  Code Status: DNR, Full for surgery Family Communication: Daughters updated at bedside Disposition Plan: Admit to med-surg Consults called: Orthopedic surgery Admission status: Inpatient  Subjective: Pt demented  Objective: Vitals:   10/21/16 2345 10/22/16 0000 10/22/16 0211 10/22/16 0406  BP: (!) 151/52 (!) 151/74 (!) 151/54 139/62  Pulse: 98 98 (!) 103 (!) 102  Resp: 12 (!) 27 18 20   Temp:   98 F (36.7 C) 98.4 F (36.9 C)  TempSrc:   Oral Oral  SpO2: 100% 100% 96% 97%  Weight:      Height:        Intake/Output Summary (Last 24 hours) at 10/22/16 0929 Last data filed at 10/22/16 0400  Gross per 24 hour  Intake            199.5 ml  Output                0 ml  Net            199.5 ml   Filed Weights   10/21/16 2046  Weight: 63.5 kg (140 lb)     REVIEW OF SYSTEMS  UTO Pt demented  Exam:  General exam: awake, cooperative, NAD.  Respiratory system: Clear. No increased work of breathing. Cardiovascular system: S1 & S2 heard. No JVD, murmurs, gallops, clicks or pedal edema. Gastrointestinal system: Abdomen is nondistended, soft and nontender. Normal bowel sounds heard. Central nervous system: Alert  No focal neurological deficits. Extremities: no cyanosis.  Data  Reviewed: Basic Metabolic Panel:  Recent Labs Lab 10/21/16 2111 10/22/16 0104  NA 138 137  K 3.6 3.7  CL 105 105  CO2 22 22  GLUCOSE 109* 162*  BUN 13 14  CREATININE 0.80 0.78  CALCIUM 9.2 9.1   Liver Function Tests: No results for input(s): AST, ALT, ALKPHOS, BILITOT, PROT, ALBUMIN in the last 168 hours. No results for input(s): LIPASE, AMYLASE in the last 168 hours. No results for input(s): AMMONIA in the last 168 hours. CBC:  Recent Labs Lab 10/21/16 2111 10/22/16 0104  WBC 12.8* 19.0*  NEUTROABS 9.2*  --   HGB 12.4 12.6  HCT 38.3 38.5  MCV 93.6 93.9  PLT 237 217   Cardiac Enzymes: No results for input(s): CKTOTAL, CKMB,  CKMBINDEX, TROPONINI in the last 168 hours. CBG (last 3)  No results for input(s): GLUCAP in the last 72 hours. Recent Results (from the past 240 hour(s))  Surgical pcr screen     Status: Abnormal   Collection Time: 10/22/16 12:55 AM  Result Value Ref Range Status   MRSA, PCR NEGATIVE NEGATIVE Final   Staphylococcus aureus POSITIVE (A) NEGATIVE Final    Comment:        The Xpert SA Assay (FDA approved for NASAL specimens in patients over 35 years of age), is one component of a comprehensive surveillance program.  Test performance has been validated by [facility] for patients greater than or equal to 57 year old. It is not intended to diagnose infection nor to guide or monitor treatment.      Studies: Dg Chest Port 1 View  Result Date: 10/21/2016 CLINICAL DATA:  Preop hip fracture. EXAM: PORTABLE CHEST 1 VIEW COMPARISON:  Frontal and lateral views 09/20/2016 FINDINGS: The cardiomediastinal contours are stable from prior exam. There is atherosclerosis of the aortic arch. Stable left basilar scarring. Pulmonary vasculature is normal. No consolidation, pleural effusion, or pneumothorax. No acute osseous abnormalities are seen. Mild compression deformity at the thoracic vertebra not well seen currently. IMPRESSION: 1. No acute abnormality. 2. Left basilar scarring. 3. Thoracic aortic atherosclerosis. Electronically Signed   By: Jeb Levering M.D.   On: 10/21/2016 23:50   Dg Hip Unilat With Pelvis 2-3 Views Right  Result Date: 10/21/2016 CLINICAL DATA:  [place] at home today.  RIGHT femur pain. EXAM: DG HIP (WITH OR WITHOUT PELVIS) 2-3V RIGHT; RIGHT FEMUR 2 VIEWS COMPARISON:  None. FINDINGS: Acute impacted RIGHT femoral neck fracture in alignment. Femoral heads are located. No dislocation. No destructive bony lesions. Osteopenia. Soft tissue planes are nonsuspicious. Mild vascular calcifications. IMPRESSION: Acute nondisplaced RIGHT femoral neck fracture.  No dislocation. Electronically  Signed   By: Elon Alas M.D.   On: 10/21/2016 22:10   Dg Femur Min 2 Views Right  Result Date: 10/21/2016 CLINICAL DATA:  [place] at home today.  RIGHT femur pain. EXAM: DG HIP (WITH OR WITHOUT PELVIS) 2-3V RIGHT; RIGHT FEMUR 2 VIEWS COMPARISON:  None. FINDINGS: Acute impacted RIGHT femoral neck fracture in alignment. Femoral heads are located. No dislocation. No destructive bony lesions. Osteopenia. Soft tissue planes are nonsuspicious. Mild vascular calcifications. IMPRESSION: Acute nondisplaced RIGHT femoral neck fracture.  No dislocation. Electronically Signed   By: Elon Alas M.D.   On: 10/21/2016 22:10   Scheduled Meds: . docusate sodium  100 mg Oral BID  . donepezil  5 mg Oral QHS  . levothyroxine  50 mcg Oral QAC breakfast  . loratadine  10 mg Oral Daily  . LORazepam  0.5 mg Oral  BID  . memantine  10 mg Oral BID  . pravastatin  40 mg Oral QHS  . simethicone  80 mg Oral QHS   Continuous Infusions: . sodium chloride 70 mL/hr at 10/22/16 0109    Principal Problem:   Closed right hip fracture, initial encounter [facility]) Active Problems:   Dementia   Essential hypertension   Hypothyroidism   History of stroke   Moderate aortic valve insufficiency   Chronic diastolic CHF (congestive heart failure) (HCC)   Leukocytosis  Time spent:   Irwin Brakeman, MD, FAAFP [facility] Pager [phone]  If 7PM-7AM, please contact night-coverage [email or web address] Password [email or web address] 10/22/2016, 9:29 AM    LOS: 1 day

## 2016-10-23 ENCOUNTER — Encounter (HOSPITAL_COMMUNITY): Payer: Self-pay | Admitting: Family Medicine

## 2016-10-23 DIAGNOSIS — R569 Unspecified convulsions: Secondary | ICD-10-CM

## 2016-10-23 DIAGNOSIS — S72001A Fracture of unspecified part of neck of right femur, initial encounter for closed fracture: Principal | ICD-10-CM

## 2016-10-23 DIAGNOSIS — D72829 Elevated white blood cell count, unspecified: Secondary | ICD-10-CM

## 2016-10-23 DIAGNOSIS — Z8673 Personal history of transient ischemic attack (TIA), and cerebral infarction without residual deficits: Secondary | ICD-10-CM

## 2016-10-23 DIAGNOSIS — I10 Essential (primary) hypertension: Secondary | ICD-10-CM

## 2016-10-23 LAB — CBC
HCT: 29 % — ABNORMAL LOW (ref 36.0–46.0)
Hemoglobin: 9.1 g/dL — ABNORMAL LOW (ref 12.0–15.0)
MCH: 29.3 pg (ref 26.0–34.0)
MCHC: 31.4 g/dL (ref 30.0–36.0)
MCV: 93.2 fL (ref 78.0–100.0)
PLATELETS: 181 10*3/uL (ref 150–400)
RBC: 3.11 MIL/uL — AB (ref 3.87–5.11)
RDW: 14.5 % (ref 11.5–15.5)
WBC: 14.2 10*3/uL — AB (ref 4.0–10.5)

## 2016-10-23 LAB — BASIC METABOLIC PANEL
ANION GAP: 8 (ref 5–15)
BUN: 12 mg/dL (ref 6–20)
CALCIUM: 8.1 mg/dL — AB (ref 8.9–10.3)
CO2: 23 mmol/L (ref 22–32)
Chloride: 105 mmol/L (ref 101–111)
Creatinine, Ser: 0.78 mg/dL (ref 0.44–1.00)
GFR calc Af Amer: >60 mL/min (ref >60)
GLUCOSE: 152 mg/dL — AB (ref 65–99)
POTASSIUM: 4 mmol/L (ref 3.5–5.1)
SODIUM: 136 mmol/L (ref 135–145)

## 2016-10-23 LAB — VITAMIN D 25 HYDROXY (VIT D DEFICIENCY, FRACTURES): VIT D 25 HYDROXY: 20.3 ng/mL — AB (ref 30.0–100.0)

## 2016-10-23 MED ORDER — MUPIROCIN 2 % EX OINT
1.0000 | TOPICAL_OINTMENT | Freq: Two times a day (BID) | CUTANEOUS | Status: DC
Start: 2016-10-23 — End: 2016-10-25
  Administered 2016-10-23 – 2016-10-25 (×4): 1 via NASAL
  Filled 2016-10-23: qty 22

## 2016-10-23 MED ORDER — CHLORHEXIDINE GLUCONATE CLOTH 2 % EX PADS
6.0000 | MEDICATED_PAD | Freq: Every day | CUTANEOUS | Status: DC
  Administered 2016-10-23 – 2016-10-24 (×2): 6 via TOPICAL

## 2016-10-23 NOTE — Care Management Note (Signed)
Case Management Note  Patient Details  Name: Patricia Shepherd MRN: [id number] Date of Birth: March 08, 1928  Subjective/Objective:                    Action/Plan:  10/23/26 Prosthetic replacement for femoral neck fracture  Will await PT/OT evals Expected Discharge Date:                  Expected Discharge Plan:     In-House Referral:     Discharge planning Services  CM Consult  Post Acute Care Choice:  Home Health, Durable Medical Equipment Choice offered to:     DME Arranged:    DME Agency:     HH Arranged:    HH Agency:     Status of Service:  In process, will continue to follow  If discussed at Long Length of Stay Meetings, dates discussed:    Additional Comments:  Marilu Favre, RN 10/23/2016, 8:08 AM

## 2016-10-23 NOTE — Clinical Social Work Note (Signed)
Clinical Social Work Assessment  Patient Details  Name: Patricia Shepherd MRN: [id number] Date of Birth: 1927/09/23  Date of referral:  10/23/16               Reason for consult:  Facility Placement                Permission sought to share information with:  Facility Art therapist granted to share information::  Yes, Verbal Permission Granted  Name::     sister  Agency::  SNF  Relationship::     Contact Information:     Housing/Transportation Living arrangements for the past 2 months:  Single Family Home Source of Information:  Adult Children Patient Interpreter Needed:  None Criminal Activity/Legal Involvement Pertinent to Current Situation/Hospitalization:  No - Comment as needed Significant Relationships:  Adult Children, Other Family Members Lives with:  Self, Other (Comment) (Has home aids) Do you feel safe going back to the place where you live?  No Need for family participation in patient care:  Yes (Comment)  Care giving concerns:  Patient resided at home prior to hospitalization. Daughter, Baldo Ash, at bedside indicated that mom resides at home as has 24 hour support-between home aids of volunteers and sisters.  They assist with all ADL's.  Patient not safe to return home at this time.  Social Worker assessment / plan:  CSW met with family-daughter at bedside to discuss clinical teams recommendations at DC to continue rehabilitation needs at a SNF.  Family on board with recommendations. Family has experience with SNF placement as this daughter is a former Marine scientist. CSW obtained permission to send out offers to facilities in [place] area.  [place]2 pending as PASSR pending( 30 day note will need to be signed by Dr. Wynetta Emery). Offers sent.   Employment status:  Retired Nurse, adult PT Recommendations:  [place] / Referral to community resources:  [place]  Patient/Family's Response to care:  Patient  family appreciative of CSW assistance with placement. No issues or concerns at this time.  Patient/Family's Understanding of and Emotional Response to Diagnosis, Current Treatment, and Prognosis:  Patient family has good understanding of diagnosis, current treatment, and prognosis. They are hopeful that physical impairment will be addressed. No issues or concerns at this time.  Emotional Assessment Appearance:  Appears stated age Attitude/Demeanor/Rapport:   (Coopertive, Non-verbal) Affect (typically observed):  Accepting, Appropriate Orientation:  Oriented to Self, Oriented to Situation Alcohol / Substance use:  Not Applicable Psych involvement (Current and /or in the community):  No (Comment)  Discharge Needs  Concerns to be addressed:  Care Coordination Readmission within the last 30 days:  Yes Current discharge risk:  Physical Impairment, Dependent with Mobility Barriers to Discharge:  No Barriers Identified   Normajean Baxter, LCSW 10/23/2016, 3:22 PM

## 2016-10-23 NOTE — Evaluation (Signed)
Physical Therapy Evaluation Patient Details Name: Patricia Shepherd MRN: [id number] DOB: December 23, 1927 Today's Date: 10/23/2016   History of Present Illness  Pt is an 83 y/o F s/p Rt hip replacement (anterior approach) after Rt femoral fx post fall. PMH includes dementia, HTN, and CVA 2015.   Clinical Impression  Pt shows decreased cognition and A&Ox2 and lethargic. Pt's daughter is bedside and reports this is usual for the pt. Pt requires max VCs and TCs for all bed mobility, transfers, and ambulation and showed to be impulsive when trying to sit before reaching chair. Pt shows deficits from PT problem list below and would benefit from acute therapy services to improve strength, transfers, and mobility. Pt would also benefit from therapy at SNF.     Follow Up Recommendations SNF    Equipment Recommendations  None recommended by PT    Recommendations for Other Services       Precautions / Restrictions Precautions Precautions: Fall      Mobility  Bed Mobility Overal bed mobility: Needs Assistance Bed Mobility: Rolling Rolling: +2 for physical assistance;Max assist Sidelying to sit: +2 for physical assistance;Max assist       General bed mobility comments: Pt required VCs and TCs along with physical assist to elevate trunk and pivot to EOB.   Transfers Overall transfer level: Needs assistance   Transfers: Sit to/from Stand Sit to Stand: Mod assist;+2 physical assistance         General transfer comment: Pt required physical assist for anterior translation and VCs for pushing up from bed   Ambulation/Gait Ambulation/Gait assistance: Mod assist;+2 physical assistance Ambulation Distance (Feet): 40 Feet Assistive device: Rolling walker (2 wheeled) Gait Pattern/deviations: Step-to pattern;Decreased stride length;Trunk flexed;Narrow base of support   Gait velocity interpretation: Below normal speed for age/gender General Gait Details: Pt requires VCs for stepping into walker and  physical assist for anterior translation +2 for safety and chair follow for next session  Stairs            Wheelchair Mobility    Modified Rankin (Stroke Patients Only)       Balance Overall balance assessment: Needs assistance;History of Falls Sitting-balance support: Bilateral upper extremity supported;Feet supported Sitting balance-Leahy Scale: Zero   Postural control: Posterior lean Standing balance support: Bilateral upper extremity supported;During functional activity Standing balance-Leahy Scale: Zero                               Pertinent Vitals/Pain Pain Assessment: Faces Pain Score: 6  Pain Location: Pt reports having a headache before beginning session that subsided before initiating ambulation. Pt reported pain in anterior R hip during ambulation.  Pain Descriptors / Indicators: Discomfort Pain Intervention(s): Monitored during session    Home Living Family/patient expects to be discharged to:: Private residence Living Arrangements: Children;Other relatives (24 hr assist of family members at all times ) Available Help at Discharge: Family Type of Home: House Home Access: Stairs to enter Entrance Stairs-Rails: None Entrance Stairs-Number of Steps: 1 Home Layout: One level Home Equipment: Environmental consultant - 2 wheels;Bedside commode;Cane - single point;Grab bars - tub/shower;Wheelchair - manual;Tub bench      Prior Function Level of Independence: Needs assistance   Gait / Transfers Assistance Needed: Daughter reports pt required moderate assist x1 for all transfers and ambulation at home   ADL's / Homemaking Assistance Needed: Assist with all ADLs         Hand Dominance  Extremity/Trunk Assessment   Upper Extremity Assessment Upper Extremity Assessment: Defer to OT evaluation    Lower Extremity Assessment Lower Extremity Assessment: Generalized weakness    Cervical / Trunk Assessment Cervical / Trunk Assessment: Kyphotic   Communication      Cognition Arousal/Alertness: Lethargic Behavior During Therapy: Impulsive Overall Cognitive Status: History of cognitive impairments - at baseline                                 General Comments: Pt lethargic and impulsive with transfers- Pt needed max VCs to halt premature sitting in stand->sit transfer      General Comments      Exercises     Assessment/Plan    PT Assessment Patient needs continued PT services  PT Problem List Decreased strength;Decreased activity tolerance;Decreased balance;Decreased mobility;Decreased cognition;Decreased safety awareness       PT Treatment Interventions Gait training;Functional mobility training;Balance training;Patient/family education    PT Goals (Current goals can be found in the Care Plan section)  Acute Rehab PT Goals Patient Stated Goal: Pt's daughter plans for pt to go to SNF.  PT Goal Formulation: With family Time For Goal Achievement: 11/06/16 Potential to Achieve Goals: Good    Frequency Min 3X/week   Barriers to discharge        Co-evaluation               AM-PAC PT "6 Clicks" Daily Activity  Outcome Measure Difficulty turning over in bed (including adjusting bedclothes, sheets and blankets)?: Total Difficulty moving from lying on back to sitting on the side of the bed? : Total Difficulty sitting down on and standing up from a chair with arms (e.g., wheelchair, bedside commode, etc,.)?: Total Help needed moving to and from a bed to chair (including a wheelchair)?: Total Help needed walking in hospital room?: Total Help needed climbing 3-5 steps with a railing? : Total 6 Click Score: 6    End of Session Equipment Utilized During Treatment: Gait belt;Oxygen Activity Tolerance: Patient limited by fatigue Patient left: in chair;with call bell/phone within reach;with family/visitor present;with chair alarm set (with 02 2 L, nasal cannula) Nurse Communication: Mobility status  (Impulsivity; fall risk ) PT Visit Diagnosis: Unsteadiness on feet (R26.81);Other abnormalities of gait and mobility (R26.89);Muscle weakness (generalized) (M62.81);History of falling (Z91.81);Difficulty in walking, not elsewhere classified (R26.2)    Time: 1655-3748 PT Time Calculation (min) (ACUTE ONLY): 29 min   Charges:   PT Evaluation $PT Eval Moderate Complexity: 1 Procedure PT Treatments $Gait Training: 8-22 mins   PT G Codes:        Elberta Leatherwood, SPT Acute Rehab [place] 10/23/2016, 11:11 AM

## 2016-10-23 NOTE — Progress Notes (Signed)
PROGRESS NOTE  Patricia Shepherd  BSW:[id number]  DOB: [birth year]  DOA: 10/21/2016 PCP: Dione Housekeeper, MD  Brief Admission Hx: Patricia Shepherd is a 83 y.o. female with medical history significant for history of ischemic strokes, Alzheimer dementia, hypothyroidism, hypertension, chronic diastolic CHF, and moderate aortic regurgitation, now presenting to the emergency department with severe right hip pain after a fall at home.  MDM/Assessment & Plan:   1. Right femoral neck fracture   - POD#1 s/p prosthetic replacement done on 6/20 by Dr. Erlinda Hong. - She takes ASA 325 mg daily, last dose 10/21/16, currently holding, resume when ok with ortho Plan/RTC: Return in 2 weeks for staple removal. Return in 6 weeks to see MD.  Weight Bearing/Load Lower Extremity: full  Hip precautions: none Suture Removal: 10-14 days  Betadine to incision twice daily once dressing is removed on POD#7 -awaiting PT/OT eval for placement, consult social worker  2. Alzheimer dementia  - Advanced, progressing as expected  - Continue Namenda and Aricept - Family counseled regarding possibility of hospital delirium and agitation   3. History of CVA's  - No evidence for acute CVA  - Managed with statin and ASA 325 mg daily  - Continue statin, hold ASA preoperatively    4. Chronic diastolic CHF, moderate aortic insufficiency  - Appears slightly hypovolemic on admission and will be provided NS infusion overnight  - TTE (06/02/14) with EF 65-70%, mild LVH, no WMA's, grade 2 diastolic dysfunction, moderate LAE, moderate AR, and mild MR - She has never had volume issues; no diuretics at home; breathing is unlabored and cxr clear on admission - Follow I/O's, continue NS infusion while NPO    5. Hypertension  - BP at goal  - Hydralazine IVP's available prn    6. Hypothyroidism  - Continue Synthroid   7. Leukocytosis  - Likely reactive to the acute hip fracture  - No fever or apparent infectious process; CXR clear and no  respiratory s/s - UA negative for infection  8.  History of seizures per family - controlled with lorazepam, following.   DVT prophylaxis: SCD's; per ortho  Code Status: DNR, Full for surgery Family Communication: Daughters updated at bedside Disposition Plan: Admit to med-surg Consults called: Orthopedic surgery Admission status: Inpatient  Subjective: Pt tolerated surgery, no further seizures noted  Objective: Vitals:   10/22/16 1614 10/22/16 1658 10/22/16 2002 10/23/16 0430  BP: 126/60 (!) 143/65 (!) 119/45 (!) 108/32  Pulse: 96 97 88 79  Resp: 13  14 15   Temp: 97.3 F (36.3 C) 97.5 F (36.4 C) 97.6 F (36.4 C) 98.8 F (37.1 C)  TempSrc:  Tympanic Axillary Oral  SpO2: 93% 96% 94% 93%  Weight:      Height:        Intake/Output Summary (Last 24 hours) at 10/23/16 0844 Last data filed at 10/23/16 0600  Gross per 24 hour  Intake          3674.58 ml  Output             1050 ml  Net          2624.58 ml   Filed Weights   10/21/16 2046  Weight: 63.5 kg (140 lb)     REVIEW OF SYSTEMS  UTO Pt with advanced dementia  Exam:  General exam: awake, cooperative, NAD.  Respiratory system: Clear. No increased work of breathing. Cardiovascular system: S1 & S2 heard. No JVD, murmurs, gallops, clicks or pedal edema. Gastrointestinal system: Abdomen is nondistended,  soft and nontender. Normal bowel sounds heard. Central nervous system: Alert  No focal neurological deficits. Extremities: no cyanosis.  Data Reviewed: Basic Metabolic Panel:  Recent Labs Lab 10/21/16 2111 10/22/16 0104 10/23/16 0548  NA 138 137 136  K 3.6 3.7 4.0  CL 105 105 105  CO2 22 22 23   GLUCOSE 109* 162* 152*  BUN 13 14 12   CREATININE 0.80 0.78 0.78  CALCIUM 9.2 9.1 8.1*   Liver Function Tests: No results for input(s): AST, ALT, ALKPHOS, BILITOT, PROT, ALBUMIN in the last 168 hours. No results for input(s): LIPASE, AMYLASE in the last 168 hours. No results for input(s): AMMONIA in the  last 168 hours. CBC:  Recent Labs Lab 10/21/16 2111 10/22/16 0104 10/23/16 0548  WBC 12.8* 19.0* 14.2*  NEUTROABS 9.2*  --   --   HGB 12.4 12.6 9.1*  HCT 38.3 38.5 29.0*  MCV 93.6 93.9 93.2  PLT 237 217 181   Cardiac Enzymes: No results for input(s): CKTOTAL, CKMB, CKMBINDEX, TROPONINI in the last 168 hours. CBG (last 3)  No results for input(s): GLUCAP in the last 72 hours. Recent Results (from the past 240 hour(s))  Surgical pcr screen     Status: Abnormal   Collection Time: 10/22/16 12:55 AM  Result Value Ref Range Status   MRSA, PCR NEGATIVE NEGATIVE Final   Staphylococcus aureus POSITIVE (A) NEGATIVE Final    Comment:        The Xpert SA Assay (FDA approved for NASAL specimens in patients over 52 years of age), is one component of a comprehensive surveillance program.  Test performance has been validated by [facility] for patients greater than or equal to 64 year old. It is not intended to diagnose infection nor to guide or monitor treatment.      Studies: Pelvis Portable  Result Date: 10/22/2016 CLINICAL DATA:  Right hip fracture.  Right total hip replacement. EXAM: PORTABLE PELVIS 1-2 VIEWS COMPARISON:  Intraoperative images dated 10/22/2016 FINDINGS: The femoral and acetabular components of the bipolar hip prosthesis appear in good position in the AP projection. No fracture. Surgical staple line noted. IMPRESSION: Satisfactory appearance of the right hip after total hip replacement. Electronically Signed   By: Lorriane Shire M.D.   On: 10/22/2016 15:30   Dg Chest Port 1 View  Result Date: 10/21/2016 CLINICAL DATA:  Preop hip fracture. EXAM: PORTABLE CHEST 1 VIEW COMPARISON:  Frontal and lateral views 09/20/2016 FINDINGS: The cardiomediastinal contours are stable from prior exam. There is atherosclerosis of the aortic arch. Stable left basilar scarring. Pulmonary vasculature is normal. No consolidation, pleural effusion, or pneumothorax. No acute osseous  abnormalities are seen. Mild compression deformity at the thoracic vertebra not well seen currently. IMPRESSION: 1. No acute abnormality. 2. Left basilar scarring. 3. Thoracic aortic atherosclerosis. Electronically Signed   By: Jeb Levering M.D.   On: 10/21/2016 23:50   Dg C-arm 1-60 Min  Result Date: 10/22/2016 CLINICAL DATA:  Right hip fracture. EXAM: OPERATIVE right HIP (WITH PELVIS IF PERFORMED) 4 VIEWS TECHNIQUE: Fluoroscopic spot image(s) were submitted for interpretation post-operatively. FLUOROSCOPY TIME:  13 seconds. COMPARISON:  Radiographs of October 21, 2016. FINDINGS: Four intraoperative fluoroscopic images of the right hip were obtained. These demonstrate placement of right hip arthroplasty. Expected postoperative changes are seen in the surrounding soft tissues. IMPRESSION: Status post right hip arthroplasty for treatment of fracture. Electronically Signed   By: Marijo Conception, M.D.   On: 10/22/2016 15:15   Dg Hip Operative Unilat W  Or W/o Pelvis Right  Result Date: 10/22/2016 CLINICAL DATA:  Right hip fracture. EXAM: OPERATIVE right HIP (WITH PELVIS IF PERFORMED) 4 VIEWS TECHNIQUE: Fluoroscopic spot image(s) were submitted for interpretation post-operatively. FLUOROSCOPY TIME:  13 seconds. COMPARISON:  Radiographs of October 21, 2016. FINDINGS: Four intraoperative fluoroscopic images of the right hip were obtained. These demonstrate placement of right hip arthroplasty. Expected postoperative changes are seen in the surrounding soft tissues. IMPRESSION: Status post right hip arthroplasty for treatment of fracture. Electronically Signed   By: Marijo Conception, M.D.   On: 10/22/2016 15:15   Dg Hip Unilat With Pelvis 2-3 Views Right  Result Date: 10/21/2016 CLINICAL DATA:  [place] at home today.  RIGHT femur pain. EXAM: DG HIP (WITH OR WITHOUT PELVIS) 2-3V RIGHT; RIGHT FEMUR 2 VIEWS COMPARISON:  None. FINDINGS: Acute impacted RIGHT femoral neck fracture in alignment. Femoral heads are located. No  dislocation. No destructive bony lesions. Osteopenia. Soft tissue planes are nonsuspicious. Mild vascular calcifications. IMPRESSION: Acute nondisplaced RIGHT femoral neck fracture.  No dislocation. Electronically Signed   By: Elon Alas M.D.   On: 10/21/2016 22:10   Dg Femur Min 2 Views Right  Result Date: 10/21/2016 CLINICAL DATA:  [place] at home today.  RIGHT femur pain. EXAM: DG HIP (WITH OR WITHOUT PELVIS) 2-3V RIGHT; RIGHT FEMUR 2 VIEWS COMPARISON:  None. FINDINGS: Acute impacted RIGHT femoral neck fracture in alignment. Femoral heads are located. No dislocation. No destructive bony lesions. Osteopenia. Soft tissue planes are nonsuspicious. Mild vascular calcifications. IMPRESSION: Acute nondisplaced RIGHT femoral neck fracture.  No dislocation. Electronically Signed   By: Elon Alas M.D.   On: 10/21/2016 22:10   Scheduled Meds: . docusate sodium  100 mg Oral BID  . donepezil  5 mg Oral QHS  . enoxaparin (LOVENOX) injection  40 mg Subcutaneous Q24H  . levothyroxine  50 mcg Oral QAC breakfast  . loratadine  10 mg Oral Daily  . LORazepam  0.5 mg Oral BID  . memantine  10 mg Oral BID  . povidone-iodine  2 application Topical Once  . pravastatin  40 mg Oral QHS  . simethicone  80 mg Oral QHS   Continuous Infusions: . sodium chloride 125 mL/hr at 10/23/16 0128  .  ceFAZolin (ANCEF) IV 2 g (10/23/16 0836)  . methocarbamol (ROBAXIN)  IV      Principal Problem:   Closed displaced fracture of right femoral neck (HCC) Active Problems:   Dementia   Essential hypertension   Hypothyroidism   History of stroke   Moderate aortic valve insufficiency   Chronic diastolic CHF (congestive heart failure) (HCC)   Leukocytosis   Seizures ([place])  Time spent:   Irwin Brakeman, MD, FAAFP [facility] Pager [phone]  If 7PM-7AM, please contact night-coverage [email or web address] Password [facility] 10/23/2016, 8:44 AM    LOS: 2 days

## 2016-10-23 NOTE — Evaluation (Signed)
Occupational Therapy Evaluation Patient Details Name: Patricia Shepherd MRN: [id number] DOB: [birth year] Today's Date: 10/23/2016    History of Present Illness Pt is an 83 y/o F s/p Rt hip replacement (anterior approach) after Rt femoral fx post fall. PMH includes dementia, HTN, and CVA 2015.    Clinical Impression   Pt with decline in function and safety with ADLs and ADL mobility with decreased strength, balance and endurance. Pt with hx of cognitive impairments (dementia and from CVA). Pt requirs total A for ADLs and mod A + 2 for mobility. Pt would benefit from acute OT services to address impairments to increase level of function and safety    Follow Up Recommendations  SNF;Supervision/Assistance - 24 hour    Equipment Recommendations  Other (comment) (TBD at next nevue of care)    Recommendations for Other Services       Precautions / Restrictions Precautions Precautions: Fall Restrictions Weight Bearing Restrictions: Yes Other Position/Activity Restrictions: WBAT      Mobility Bed Mobility Overal bed mobility: Needs Assistance Bed Mobility: Sit to Supine Rolling: +2 for physical assistance;Max assist Sidelying to sit: +2 for physical assistance;Max assist   Sit to supine: Total assist   General bed mobility comments: pt up in recliner upon arrival. assisted pt back to bed  Transfers Overall transfer level: Needs assistance   Transfers: Sit to/from Stand Sit to Stand: Mod assist;+2 physical assistance         General transfer comment: Pt required physical assist for anterior translation and VCs for pushing up from bed     Balance Overall balance assessment: Needs assistance;History of Falls Sitting-balance support: Bilateral upper extremity supported;Feet supported Sitting balance-Leahy Scale: Zero   Postural control: Posterior lean Standing balance support: Bilateral upper extremity supported;During functional activity Standing balance-Leahy Scale: Zero                              ADL either performed or assessed with clinical judgement   ADL Overall ADL's : Needs assistance/impaired     Grooming: Wash/dry hands;Wash/dry face;Sitting;Supervision/safety;Set up Grooming Details (indicate cue type and reason): min verbal cues to initiate Upper Body Bathing: Total assistance   Lower Body Bathing: Total assistance   Upper Body Dressing : Total assistance   Lower Body Dressing: Total assistance   Toilet Transfer: Moderate assistance;+2 for safety/equipment;+2 for physical assistance (simulated)   Toileting- Clothing Manipulation and Hygiene: Total assistance       Functional mobility during ADLs: Moderate assistance;+2 for physical assistance;+2 for safety/equipment;Cueing for sequencing       Vision Patient Visual Report: No change from baseline                  Pertinent Vitals/Pain Pain Assessment: 0-10 Pain Score: 6  Faces Pain Scale: Hurts little more Pain Location: Pt reports having a headache before beginning session that subsided before initiating ambulation. Pt reported pain in anterior R hip during ambulation.  Pain Descriptors / Indicators: Discomfort Pain Intervention(s): Limited activity within patient's tolerance;Monitored during session;Repositioned     Hand Dominance Right   Extremity/Trunk Assessment Upper Extremity Assessment Upper Extremity Assessment: Generalized weakness   Lower Extremity Assessment Lower Extremity Assessment: Defer to PT evaluation   Cervical / Trunk Assessment Cervical / Trunk Assessment: Kyphotic   Communication Communication Communication: No difficulties   Cognition Arousal/Alertness: Awake/alert Behavior During Therapy: Impulsive Overall Cognitive Status: History of cognitive impairments - at baseline  General Comments: Pt lethargic and impulsive with transfers- Pt needed max VCs to halt premature sitting in  stand->sit transfer   General Comments   pt very pleasant and cooperative, daughter very supportive               Home Living Family/patient expects to be discharged to:: Private residence Living Arrangements: Children;Other relatives Available Help at Discharge: Family Type of Home: House Home Access: Stairs to enter [employer] of Steps: 1 Entrance Stairs-Rails: None Home Layout: One level     Bathroom Shower/Tub: Teacher, early years/pre: Handicapped height Bathroom Accessibility: Yes   Home Equipment: Environmental consultant - 2 wheels;Bedside commode;Cane - single point;Grab bars - tub/shower;Wheelchair - manual;Tub bench          Prior Functioning/Environment Level of Independence: Needs assistance  Gait / Transfers Assistance Needed: Daughter reports pt required moderate assist x1 for all transfers and ambulation at home  ADL's / Homemaking Assistance Needed: Assist with all ADLs             OT Problem List: Decreased strength;Impaired balance (sitting and/or standing);Decreased cognition;Decreased knowledge of precautions;Pain;Decreased activity tolerance;Decreased coordination;Decreased knowledge of use of DME or AE      OT Treatment/Interventions: Self-care/ADL training;DME and/or AE instruction;Therapeutic activities;Patient/family education;Therapeutic exercise    OT Goals(Current goals can be found in the care plan section) Acute Rehab OT Goals Patient Stated Goal: Pt's daughter plans for pt to go to SNF.  OT Goal Formulation: With patient Time For Goal Achievement: 10/30/16 Potential to Achieve Goals: Good ADL Goals Pt Will Perform Grooming: with min guard assist;sitting Pt Will Perform Upper Body Bathing: with mod assist;sitting Pt Will Transfer to Toilet: with mod assist;with min assist;stand pivot transfer;bedside commode Additional ADL Goal #1: Pt will sit EOB x 5 minites with mod A for balance/support during grooming tasks  OT Frequency:  Min 2X/week   Barriers to D/C: Decreased caregiver support                        AM-PAC PT "6 Clicks" Daily Activity     Outcome Measure Help from another person eating meals?: None Help from another person taking care of personal grooming?: A Little Help from another person toileting, which includes using toliet, bedpan, or urinal?: Total Help from another person bathing (including washing, rinsing, drying)?: Total Help from another person to put on and taking off regular upper body clothing?: Total Help from another person to put on and taking off regular lower body clothing?: Total 6 Click Score: 11   End of Session Equipment Utilized During Treatment: Gait belt;Oxygen  Activity Tolerance: Patient tolerated treatment well Patient left: in bed;with call bell/phone within reach;with bed alarm set;with family/visitor present  OT Visit Diagnosis: Unsteadiness on feet (R26.81);Other abnormalities of gait and mobility (R26.89);Muscle weakness (generalized) (M62.81);Other (comment);Other symptoms and signs involving cognitive function (hx of cognitive impairments)                Time: 5093-2671 OT Time Calculation (min): 25 min Charges:  OT General Charges $OT Visit: 1 Procedure OT Evaluation $OT Eval Moderate Complexity: 1 Procedure OT Treatments $Therapeutic Activity: 8-22 mins G-Codes: OT G-codes **NOT FOR INPATIENT CLASS** Functional Assessment Tool Used: AM-PAC 6 Clicks Daily Activity     Britt Bottom 10/23/2016, 1:03 PM

## 2016-10-23 NOTE — Evaluation (Signed)
Clinical/Bedside Swallow Evaluation Patient Details  Name: Patricia Shepherd MRN: [id number] Date of Birth: December 15, 1927  Today's Date: 10/23/2016 Time: SLP Start Time (ACUTE ONLY): 42 SLP Stop Time (ACUTE ONLY): 1015 SLP Time Calculation (min) (ACUTE ONLY): 10 min  Past Medical History:  Past Medical History:  Diagnosis Date  . Anxiety   . Arthritis   . Dementia   . Hiatal hernia   . Hypertension   . Peripheral vascular disease ([place])   . Seizures ([place])   . Skin cancer 06/13/2016   left nostril- basal cell  . Stroke ([place])   . Syncope   . Thyroid disease    Past Surgical History: History reviewed. No pertinent surgical history. HPI:  Patricia Shepherd is a 83 y.o. female with medical history significant for history of ischemic strokes, Alzheimer dementia, hypothyroidism, hypertension, chronic diastolic CHF, and moderate aortic regurgitation, now presenting to the emergency department with severe right hip pain after a fall at home.   Assessment / Plan / Recommendation Clinical Impression    Pt presents with grossly intact, age appropriate swallowing function.  No overt s/s of aspiration were evident with solids or liquids and pt was able to clear solids from the oral cavity without difficulty.  Swallow initiation appeared timely or pt's age.  Pt's daughter reports good toleration of current diet and no changes in swallowing function from baseline.  As a result, recommend that pt remain on regular textures and thin liquids.  No ST follow up needed at this time.      Aspiration Risk  No limitations    Diet Recommendation     Medication Administration: Whole meds with liquid    Other  Recommendations Oral Care Recommendations: Oral care BID   Follow up Recommendations None        Swallow Study   General Date of Onset:  (See h&p) HPI: Patricia Shepherd is a 83 y.o. female with medical history significant for history of ischemic strokes, Alzheimer dementia, hypothyroidism, hypertension,  chronic diastolic CHF, and moderate aortic regurgitation, now presenting to the emergency department with severe right hip pain after a fall at home. Type of Study: Bedside Swallow Evaluation Previous Swallow Assessment: none on record, dtr reports hx of dysphagia s/p CVA 2 years ago Diet Prior to this Study: Regular;Thin liquids Temperature Spikes Noted: No Respiratory Status: Nasal cannula History of Recent Intubation: No Behavior/Cognition: Alert;Cooperative;Pleasant mood Oral Cavity Assessment: Within Functional Limits Oral Care Completed by SLP: No Oral Cavity - Dentition: Edentulous Vision: Functional for self-feeding Self-Feeding Abilities: Able to feed self Patient Positioning: Upright in chair Baseline Vocal Quality: Normal Volitional Cough: Strong    Oral/Motor/Sensory Function Overall Oral Motor/Sensory Function: Within functional limits   Ice Chips     Thin Liquid Thin Liquid: Within functional limits    Nectar Thick     Honey Thick     Puree Puree: Within functional limits   Solid   GO   Solid: Within functional limits        Emira Eubanks, Elmyra Ricks L 10/23/2016,10:22 AM

## 2016-10-24 DIAGNOSIS — G309 Alzheimer's disease, unspecified: Secondary | ICD-10-CM

## 2016-10-24 DIAGNOSIS — F0280 Dementia in other diseases classified elsewhere without behavioral disturbance: Secondary | ICD-10-CM

## 2016-10-24 DIAGNOSIS — E039 Hypothyroidism, unspecified: Secondary | ICD-10-CM

## 2016-10-24 DIAGNOSIS — I5032 Chronic diastolic (congestive) heart failure: Secondary | ICD-10-CM

## 2016-10-24 LAB — CBC
HCT: 25.9 % — ABNORMAL LOW (ref 36.0–46.0)
Hemoglobin: 8.3 g/dL — ABNORMAL LOW (ref 12.0–15.0)
MCH: 30.2 pg (ref 26.0–34.0)
MCHC: 32 g/dL (ref 30.0–36.0)
MCV: 94.2 fL (ref 78.0–100.0)
PLATELETS: 167 10*3/uL (ref 150–400)
RBC: 2.75 MIL/uL — ABNORMAL LOW (ref 3.87–5.11)
RDW: 14.7 % (ref 11.5–15.5)
WBC: 13.5 10*3/uL — AB (ref 4.0–10.5)

## 2016-10-24 LAB — BASIC METABOLIC PANEL
Anion gap: 4 — ABNORMAL LOW (ref 5–15)
BUN: 16 mg/dL (ref 6–20)
CALCIUM: 8.2 mg/dL — AB (ref 8.9–10.3)
CO2: 27 mmol/L (ref 22–32)
CREATININE: 0.68 mg/dL (ref 0.44–1.00)
Chloride: 106 mmol/L (ref 101–111)
GLUCOSE: 116 mg/dL — AB (ref 65–99)
Potassium: 4.2 mmol/L (ref 3.5–5.1)
Sodium: 137 mmol/L (ref 135–145)

## 2016-10-24 MED ORDER — SYNTHROID 50 MCG PO TABS: 50.0000 ug | ORAL_TABLET | Freq: Every day | ORAL | 5 refills | Status: AC

## 2016-10-24 MED ORDER — LORAZEPAM 0.5 MG PO TABS: 0.5000 mg | ORAL_TABLET | Freq: Two times a day (BID) | ORAL | 2 refills | Status: AC

## 2016-10-24 MED ORDER — MUSCLE RUB 10-15 % EX CREA
TOPICAL_CREAM | CUTANEOUS | Status: DC | PRN
  Administered 2016-10-24: 17:00:00 via TOPICAL
  Filled 2016-10-24: qty 85

## 2016-10-24 MED ORDER — ZOLPIDEM TARTRATE 5 MG PO TABS: 5.0000 mg | ORAL_TABLET | Freq: Every evening | ORAL | 0 refills | Status: AC | PRN

## 2016-10-24 NOTE — Discharge Summary (Addendum)
Physician Discharge Summary  Patricia Shepherd RWE:[id number] DOB: Mar 14, 1928 DOA: 10/21/2016  PCP: Patricia Housekeeper, MD  Admit date: 10/21/2016 Discharge date: 10/25/2016  Admitted From: Disposition:  Recommendations for Outpatient Follow-up:  1. Follow up with orthopedics in 2 weeks 2. Please obtain BMP/CBC in one week 3. Please assist with feeding at all meals 4. Fall precautions  5. Resume full dose aspirin when complete lovenox course 6. Suture removal 10-14 days 7. Betadine to incision twice daily once dressing is removed on POD#7 8. Nocturnal oxygen nasal cannula 2L/min keep O2 sat 88-92%  9. Seizure precautions.  10. Check TSH in 3 months 11. Please give 1 lorazepam tab at suppertime and then 1 tab before bed 10 pm.   Discharge Condition: stable   CODE STATUS: DNR   Brief Hospitalization Summary: Please see all hospital notes, images, labs for full details of the hospitalization. HPI: Patricia Shepherd is a 83 y.o. female with medical history significant for history of ischemic strokes, Alzheimer dementia, hypothyroidism, hypertension, chronic diastolic CHF, and moderate aortic regurgitation, now presenting to the emergency department with severe right hip pain after a fall at home. Patient is accompanied by her 2 daughters who assist with the history. Patient had reportedly been in her usual state of health and was being assisted in the bathroom by her caregiver when she lost balance, falling onto her right side. She complained of immediate pain at the right hip. She was partially caught as she fell and did not strike her head. Leading up to this, she was having an uneventful day with no recent illness. Per the report of her daughters, she had been very active physically throughout her life and well into her 44s and tell a CVA in 2015 which was followed by cognitive difficulties and worsening dementia. Around this time, she also began to be less mobile. Currently, at her baseline, she is able to  ambulate with a cane and minimal assistance. She ambulates about her home like this without any dyspnea. She does not have any known coronary artery disease and has never complained of angina or palpitations. She had an echo in 2016 that demonstrates moderate aortic insufficiency and grade 2 diastolic dysfunction, but she has not had any volume issues or required diuresis. She takes a daily aspirin 325 mg for secondary stroke prophylaxis.  ED Course: Upon arrival to the ED, patient is found to be afebrile, saturating adequately on room air, and with vitals otherwise stable. EKG has been ordered but not yet performed. Chest x-ray is negative for any acute cardiac pulmonary disease. Chemistry panel is unremarkable and CBC is notable only for a leukocytosis to 12,800. INR is within the normal limits. Urinalysis remains pending. Radiographs of the right hip and femur demonstrate an acute nondisplaced right femoral neck fracture without dislocation. Type and screen was performed and the patient was treated with 2 doses of fentanyl 50 g. Orthopedic surgery was consulted by the ED physician and advised for a medical admission, requesting the patient be kept nothing by mouth after midnight. Patricia Shepherd remained hemodynamically stable in the emergency department and has not been in any apparent distress, and she will be admitted to [facility] of her ongoing evaluation and management of right femoral neck fracture.   1. Right femoral neck fracture  - Expected postop acute blood loss anemia - will monitor for symptoms - Up with PT/OT - DVT ppx - SCDs, ambulation, lovenox - WBAT operative extremity, no hip precautions - Pain control -  Discharge planning  - POD#2 s/p prosthetic replacement done on 6/20 by Dr. Erlinda Hong. - She takes ASA 325 mg daily, last dose 10/21/16, currently holding, resume when ok with ortho Plan/RTC: Return in 2 weeks for staple removal. Return in 6 weeks to see MD.  Weight Bearing/Load Lower  Extremity: full  Hip precautions: none Suture Removal: 10-14 days  Betadine to incision twice daily once dressing is removed on POD#7 -awaiting PT/OT eval for placement, consult social worker -lovenox for DVT prophylaxis per orthopedics.   2. Alzheimer dementia  - Advanced, progressing as expected  - Continue Namenda and Aricept - Family counseled regarding possibility of hospital delirium and agitation   3. History of CVA's  - No evidence for acute CVA  - Managed with statin and ASA 325 mg daily  - Continue statin, hold ASA preoperatively   4. Chronic diastolic CHF, moderate aortic insufficiency  - Appears slightly hypovolemic on admission and will be provided NS infusion overnight  - TTE (06/02/14) with EF 65-70%, mild LVH, no WMA's, grade 2 diastolic dysfunction, moderate LAE, moderate AR, and mild MR - She has never had volume issues; no diuretics at home; breathing is unlabored and cxr clear on admission  5. Hypertension  - BP at goal  - Hydralazine IVP's available prn   6. Hypothyroidism  - Continue Synthroid   7. Leukocytosis  - Likely reactive to the acute hip fracture  - No fever or apparent infectious process; CXR clear and no respiratory s/s - UA negative for infection  8.  History of seizures per family - controlled with lorazepam, following.  DVT prophylaxis:SCD's; per ortho  Code Status:DNR, Full for surgery Family Communication:Daughters updated at bedside Disposition Plan:Admit to med-surg Consults called:Orthopedic surgery Admission status:Inpatient  Discharge Diagnoses:  Principal Problem:   Closed displaced fracture of right femoral neck (HCC) Active Problems:   Dementia   Essential hypertension   Hypothyroidism   History of stroke   Moderate aortic valve insufficiency   Chronic diastolic CHF (congestive heart failure) (HCC)   Leukocytosis   Seizures (HCC)    Discharge Instructions: Discharge Instructions    Increase  activity slowly    Complete by:  As directed    Weight bearing as tolerated    Complete by:  As directed      Allergies as of 10/25/2016      Reactions   Bee Venom Swelling      Medication List    STOP taking these medications   aspirin 325 MG tablet     TAKE these medications   acetaminophen 500 MG tablet Commonly known as:  TYLENOL Take 500-1,000 mg by mouth at bedtime. Takes 2 tablets in the morning, 1 tablet at lunch and 2 tablets at bedtime   cetirizine 10 MG tablet Commonly known as:  ZYRTEC Take 5 mg by mouth at bedtime.   docusate sodium 100 MG capsule Commonly known as:  COLACE Take 100 mg by mouth See admin instructions. Every other day   donepezil 10 MG tablet Commonly known as:  ARICEPT Take 5 mg by mouth at bedtime.   enoxaparin 40 MG/0.4ML injection Commonly known as:  LOVENOX Inject 0.4 mLs (40 mg total) into the skin daily.   HYDROcodone-acetaminophen 7.5-325 MG tablet Commonly known as:  NORCO Take 1-2 tablets by mouth every 6 (six) hours as needed for moderate pain.   LORazepam 0.5 MG tablet Commonly known as:  ATIVAN Take 1 tablet (0.5 mg total) by mouth 2 (two) times daily.  What changed:  how to take this   memantine 10 MG tablet Commonly known as:  NAMENDA Take 10 mg by mouth 2 (two) times daily.   pravastatin 40 MG tablet Commonly known as:  PRAVACHOL Take 40 mg by mouth at bedtime.   simethicone 125 MG chewable tablet Commonly known as:  MYLICON Chew 323 mg by mouth at bedtime.   SYNTHROID 50 MCG tablet Generic drug:  levothyroxine Take 1 tablet (50 mcg total) by mouth daily. What changed:  how much to take   zolpidem 5 MG tablet Commonly known as:  AMBIEN Take 1 tablet (5 mg total) by mouth at bedtime as needed for sleep. What changed:  medication strength  how much to take       Contact information for follow-up providers    Leandrew Koyanagi, MD Follow up in 2 week(s).   Specialty:  Orthopedic Surgery Why:  For suture  removal, For wound re-check Contact information: [place] [address] [phone]            Contact information for after-discharge care    [facility] Follow up.   Specialty:  [place] information: [address] [place] [phone]                 Allergies  Allergen Reactions  . Bee Venom Swelling   Current Discharge Medication List    START taking these medications   Details  enoxaparin (LOVENOX) 40 MG/0.4ML injection Inject 0.4 mLs (40 mg total) into the skin daily. Qty: 14 Syringe, Refills: 0    HYDROcodone-acetaminophen (NORCO) 7.5-325 MG tablet Take 1-2 tablets by mouth every 6 (six) hours as needed for moderate pain. Qty: 90 tablet, Refills: 0      CONTINUE these medications which have CHANGED   Details  LORazepam (ATIVAN) 0.5 MG tablet Take 1 tablet (0.5 mg total) by mouth 2 (two) times daily. Qty: 12 tablet, Refills: 2    SYNTHROID 50 MCG tablet Take 1 tablet (50 mcg total) by mouth daily. Refills: 5    zolpidem (AMBIEN) 5 MG tablet Take 1 tablet (5 mg total) by mouth at bedtime as needed for sleep. Qty: 5 tablet, Refills: 0      CONTINUE these medications which have NOT CHANGED   Details  acetaminophen (TYLENOL) 500 MG tablet Take 500-1,000 mg by mouth at bedtime. Takes 2 tablets in the morning, 1 tablet at lunch and 2 tablets at bedtime    cetirizine (ZYRTEC) 10 MG tablet Take 5 mg by mouth at bedtime.    docusate sodium (COLACE) 100 MG capsule Take 100 mg by mouth See admin instructions. Every other day    donepezil (ARICEPT) 10 MG tablet Take 5 mg by mouth at bedtime.    memantine (NAMENDA) 10 MG tablet Take 10 mg by mouth 2 (two) times daily.    pravastatin (PRAVACHOL) 40 MG tablet Take 40 mg by mouth at bedtime. Refills: 4    simethicone (MYLICON) 737 MG chewable tablet Chew 125 mg by mouth at bedtime.      STOP  taking these medications     aspirin 325 MG tablet         Procedures/Studies: Pelvis Portable  Result Date: 10/22/2016 CLINICAL DATA:  Right hip fracture.  Right total hip replacement. EXAM: PORTABLE PELVIS 1-2 VIEWS COMPARISON:  Intraoperative images dated 10/22/2016 FINDINGS: The femoral and acetabular components of the bipolar hip prosthesis  appear in good position in the AP projection. No fracture. Surgical staple line noted. IMPRESSION: Satisfactory appearance of the right hip after total hip replacement. Electronically Signed   By: Lorriane Shire M.D.   On: 10/22/2016 15:30   Dg Chest Port 1 View  Result Date: 10/21/2016 CLINICAL DATA:  Preop hip fracture. EXAM: PORTABLE CHEST 1 VIEW COMPARISON:  Frontal and lateral views 09/20/2016 FINDINGS: The cardiomediastinal contours are stable from prior exam. There is atherosclerosis of the aortic arch. Stable left basilar scarring. Pulmonary vasculature is normal. No consolidation, pleural effusion, or pneumothorax. No acute osseous abnormalities are seen. Mild compression deformity at the thoracic vertebra not well seen currently. IMPRESSION: 1. No acute abnormality. 2. Left basilar scarring. 3. Thoracic aortic atherosclerosis. Electronically Signed   By: Jeb Levering M.D.   On: 10/21/2016 23:50   Dg C-arm 1-60 Min  Result Date: 10/22/2016 CLINICAL DATA:  Right hip fracture. EXAM: OPERATIVE right HIP (WITH PELVIS IF PERFORMED) 4 VIEWS TECHNIQUE: Fluoroscopic spot image(s) were submitted for interpretation post-operatively. FLUOROSCOPY TIME:  13 seconds. COMPARISON:  Radiographs of October 21, 2016. FINDINGS: Four intraoperative fluoroscopic images of the right hip were obtained. These demonstrate placement of right hip arthroplasty. Expected postoperative changes are seen in the surrounding soft tissues. IMPRESSION: Status post right hip arthroplasty for treatment of fracture. Electronically Signed   By: Marijo Conception, M.D.   On: 10/22/2016  15:15   Dg Hip Operative Unilat W Or W/o Pelvis Right  Result Date: 10/22/2016 CLINICAL DATA:  Right hip fracture. EXAM: OPERATIVE right HIP (WITH PELVIS IF PERFORMED) 4 VIEWS TECHNIQUE: Fluoroscopic spot image(s) were submitted for interpretation post-operatively. FLUOROSCOPY TIME:  13 seconds. COMPARISON:  Radiographs of October 21, 2016. FINDINGS: Four intraoperative fluoroscopic images of the right hip were obtained. These demonstrate placement of right hip arthroplasty. Expected postoperative changes are seen in the surrounding soft tissues. IMPRESSION: Status post right hip arthroplasty for treatment of fracture. Electronically Signed   By: Marijo Conception, M.D.   On: 10/22/2016 15:15   Dg Hip Unilat With Pelvis 2-3 Views Right  Result Date: 10/21/2016 CLINICAL DATA:  [place] at home today.  RIGHT femur pain. EXAM: DG HIP (WITH OR WITHOUT PELVIS) 2-3V RIGHT; RIGHT FEMUR 2 VIEWS COMPARISON:  None. FINDINGS: Acute impacted RIGHT femoral neck fracture in alignment. Femoral heads are located. No dislocation. No destructive bony lesions. Osteopenia. Soft tissue planes are nonsuspicious. Mild vascular calcifications. IMPRESSION: Acute nondisplaced RIGHT femoral neck fracture.  No dislocation. Electronically Signed   By: Elon Alas M.D.   On: 10/21/2016 22:10   Dg Femur Min 2 Views Right  Result Date: 10/21/2016 CLINICAL DATA:  [place] at home today.  RIGHT femur pain. EXAM: DG HIP (WITH OR WITHOUT PELVIS) 2-3V RIGHT; RIGHT FEMUR 2 VIEWS COMPARISON:  None. FINDINGS: Acute impacted RIGHT femoral neck fracture in alignment. Femoral heads are located. No dislocation. No destructive bony lesions. Osteopenia. Soft tissue planes are nonsuspicious. Mild vascular calcifications. IMPRESSION: Acute nondisplaced RIGHT femoral neck fracture.  No dislocation. Electronically Signed   By: Elon Alas M.D.   On: 10/21/2016 22:10     Subjective: Pt has some neck pain otherwise no complaints.  Discharge  Exam: Vitals:   10/24/16 2144 10/25/16 0419  BP: (!) 126/46 (!) 105/41  Pulse: 72 66  Resp: 18 18  Temp: 98.3 F (36.8 C) 97.6 F (36.4 C)   Vitals:   10/24/16 0554 10/24/16 1703 10/24/16 2144 10/25/16 0419  BP: (!) 111/50 (!) 134/44 Marland Kitchen)  126/46 (!) 105/41  Pulse: 62 83 72 66  Resp: 18 17 18 18   Temp: 97.9 F (36.6 C) 98.1 F (36.7 C) 98.3 F (36.8 C) 97.6 F (36.4 C)  TempSrc: Oral Oral Oral Oral  SpO2: 100% 99% 100% 97%  Weight:      Height:       General: Pt is alert, awake, not in acute distress Cardiovascular: RRR, S1/S2 +, no rubs, no gallops Respiratory: CTA bilaterally, no wheezing, no rhonchi Abdominal: Soft, NT, ND, bowel sounds + Extremities: wound clean and dry intact    The results of significant diagnostics from this hospitalization (including imaging, microbiology, ancillary and laboratory) are listed below for reference.     Microbiology: Recent Results (from the past 240 hour(s))  Surgical pcr screen     Status: Abnormal   Collection Time: 10/22/16 12:55 AM  Result Value Ref Range Status   MRSA, PCR NEGATIVE NEGATIVE Final   Staphylococcus aureus POSITIVE (A) NEGATIVE Final    Comment:        The Xpert SA Assay (FDA approved for NASAL specimens in patients over 1 years of age), is one component of a comprehensive surveillance program.  Test performance has been validated by [facility] for patients greater than or equal to 63 year old. It is not intended to diagnose infection nor to guide or monitor treatment.      Labs: BNP (last 3 results) No results for input(s): BNP in the last 8760 hours. Basic Metabolic Panel:  Recent Labs Lab 10/21/16 2111 10/22/16 0104 10/23/16 0548 10/24/16 0613  NA 138 137 136 137  K 3.6 3.7 4.0 4.2  CL 105 105 105 106  CO2 22 22 23 27   GLUCOSE 109* 162* 152* 116*  BUN 13 14 12 16   CREATININE 0.80 0.78 0.78 0.68  CALCIUM 9.2 9.1 8.1* 8.2*   Liver Function Tests: No results for input(s): AST, ALT,  ALKPHOS, BILITOT, PROT, ALBUMIN in the last 168 hours. No results for input(s): LIPASE, AMYLASE in the last 168 hours. No results for input(s): AMMONIA in the last 168 hours. CBC:  Recent Labs Lab 10/21/16 2111 10/22/16 0104 10/23/16 0548 10/24/16 0613 10/25/16 0436  WBC 12.8* 19.0* 14.2* 13.5* 12.6*  NEUTROABS 9.2*  --   --   --   --   HGB 12.4 12.6 9.1* 8.3* 8.5*  HCT 38.3 38.5 29.0* 25.9* 27.1*  MCV 93.6 93.9 93.2 94.2 96.1  PLT 237 217 181 167 179   Cardiac Enzymes: No results for input(s): CKTOTAL, CKMB, CKMBINDEX, TROPONINI in the last 168 hours. BNP: Invalid input(s): POCBNP CBG: No results for input(s): GLUCAP in the last 168 hours. D-Dimer No results for input(s): DDIMER in the last 72 hours. Hgb A1c No results for input(s): HGBA1C in the last 72 hours. Lipid Profile No results for input(s): CHOL, HDL, LDLCALC, TRIG, CHOLHDL, LDLDIRECT in the last 72 hours. Thyroid function studies No results for input(s): TSH, T4TOTAL, T3FREE, THYROIDAB in the last 72 hours.  Invalid input(s): FREET3 Anemia work up No results for input(s): VITAMINB12, FOLATE, FERRITIN, TIBC, IRON, RETICCTPCT in the last 72 hours. Urinalysis    Component Value Date/Time   COLORURINE YELLOW 10/22/2016 0200   APPEARANCEUR CLEAR 10/22/2016 0200   LABSPEC 1.013 10/22/2016 0200   PHURINE 6.0 10/22/2016 0200   GLUCOSEU NEGATIVE 10/22/2016 0200   HGBUR NEGATIVE 10/22/2016 0200   BILIRUBINUR NEGATIVE 10/22/2016 0200   KETONESUR NEGATIVE 10/22/2016 0200   PROTEINUR NEGATIVE 10/22/2016 0200   UROBILINOGEN 0.2  06/01/2014 1550   NITRITE NEGATIVE 10/22/2016 0200   LEUKOCYTESUR NEGATIVE 10/22/2016 0200   Sepsis Labs Invalid input(s): PROCALCITONIN,  WBC,  LACTICIDVEN Microbiology Recent Results (from the past 240 hour(s))  Surgical pcr screen     Status: Abnormal   Collection Time: 10/22/16 12:55 AM  Result Value Ref Range Status   MRSA, PCR NEGATIVE NEGATIVE Final   Staphylococcus aureus  POSITIVE (A) NEGATIVE Final    Comment:        The Xpert SA Assay (FDA approved for NASAL specimens in patients over 69 years of age), is one component of a comprehensive surveillance program.  Test performance has been validated by [facility] for patients greater than or equal to 85 year old. It is not intended to diagnose infection nor to guide or monitor treatment.     Time coordinating discharge: 40 mins  SIGNED:  Irwin Brakeman, MD  [facility] 10/25/2016, 9:34 AM Pager [phone]  If 7PM-7AM, please contact night-coverage [email or web address] Password [email or web address]

## 2016-10-24 NOTE — Progress Notes (Signed)
   Subjective:  Patient reports pain as mild.  No events.  Objective:   VITALS:   Vitals:   10/23/16 0937 10/23/16 1206 10/23/16 2030 10/24/16 0554  BP:  (!) 124/41 (!) 117/53 (!) 111/50  Pulse:  75 77 62  Resp:   16 18  Temp:  98.8 F (37.1 C) 98.8 F (37.1 C) 97.9 F (36.6 C)  TempSrc:  Oral Oral Oral  SpO2: (!) 89% 97% 96% 100%  Weight:      Height:        Neurovascular intact Sensation intact distally Intact pulses distally Dorsiflexion/Plantar flexion intact Incision: dressing C/D/I and no drainage No cellulitis present Compartment soft   Lab Results  Component Value Date   WBC 13.5 (H) 10/24/2016   HGB 8.3 (L) 10/24/2016   HCT 25.9 (L) 10/24/2016   MCV 94.2 10/24/2016   PLT 167 10/24/2016     Assessment/Plan:  2 Days Post-Op   - Expected postop acute blood loss anemia - will monitor for symptoms - Up with PT/OT - DVT ppx - SCDs, ambulation, lovenox - WBAT operative extremity, no hip precautions - Pain control - Discharge planning  Patricia Shepherd 10/24/2016, 7:49 AM [phone]

## 2016-10-24 NOTE — Care Management Important Message (Signed)
Important Message  Patient Details  Name: Patricia Shepherd MRN: [id number] Date of Birth: 21-Jan-1928   Medicare Important Message Given:  Yes    Chelsye Suhre 10/24/2016, 1:01 PM

## 2016-10-24 NOTE — NC FL2 (Signed)
[place] LEVEL OF CARE SCREENING TOOL     IDENTIFICATION  Patient Name: Patricia Shepherd Birthdate: October 25, 1927 Sex: female Admission Date (Current Location): 10/21/2016  [facility] and [place] Number:  Herbalist and Address:  The [place]. [facility], [place] [address], [place], [place] [address]      Provider Number: [id number]  Attending Physician Name and Address:  Murlean Iba, MD  Relative Name and Phone Number:       Current Level of Care: Hospital Recommended Level of Care: [employer] Prior Approval Number:    Date Approved/Denied: 10/23/16 PASRR Number: pending  Discharge Plan: SNF    Current Diagnoses: Patient Active Problem List   Diagnosis Date Noted  . Seizures ([place]) 10/23/2016  . Closed displaced fracture of right femoral neck ([place]) 10/21/2016  . Moderate aortic valve insufficiency 10/21/2016  . Chronic diastolic CHF (congestive heart failure) ([place]) 10/21/2016  . Leukocytosis 10/21/2016  . Essential hypertension 06/02/2014  . Hypothyroidism 06/02/2014  . History of stroke 06/02/2014  . Dementia 06/01/2014    Orientation RESPIRATION BLADDER Height & Weight     Situation, Self  O2 (Nasal Cannula) Continent Weight: 140 lb (63.5 kg) Height:  5\' 4"  (162.6 cm)  BEHAVIORAL SYMPTOMS/MOOD NEUROLOGICAL BOWEL NUTRITION STATUS      Continent Diet (See DC Summary)  AMBULATORY STATUS COMMUNICATION OF NEEDS Skin   Extensive Assist Verbally Surgical wounds (Right Hip Closed Incision with Hydrocolloid Dressing)                       Personal Care Assistance Level of Assistance  Bathing, Feeding, Dressing Bathing Assistance: Maximum assistance Feeding assistance: Maximum assistance Dressing Assistance: Maximum assistance     Functional Limitations Info  Sight, Hearing, Speech Sight Info: Adequate Hearing Info: Adequate Speech Info: Adequate    SPECIAL CARE FACTORS FREQUENCY  PT (By licensed PT), OT (By  licensed OT)     PT Frequency: 5xweek OT Frequency: 5xweek            Contractures      Additional Factors Info  Code Status, Allergies, Psychotropic Code Status Info: Full Allergies Info: BEE VENOM  Psychotropic Info: Ativan         Current Medications (10/24/2016):  This is the current hospital active medication list Current Facility-Administered Medications  Medication Dose Route Frequency Provider Last Rate Last Dose  . 0.9 %  sodium chloride infusion   Intravenous Continuous Leandrew Koyanagi, MD   Stopped at 10/23/16 1100  . acetaminophen (TYLENOL) tablet 650 mg  650 mg Oral Q6H PRN Leandrew Koyanagi, MD   650 mg at 10/23/16 1497   Or  . acetaminophen (TYLENOL) suppository 650 mg  650 mg Rectal Q6H PRN Leandrew Koyanagi, MD      . acetaminophen (TYLENOL) tablet 650 mg  650 mg Oral Q6H PRN Amali Uhls L, MD   650 mg at 10/22/16 1047  . alum & mag hydroxide-simeth (MAALOX/MYLANTA) 200-200-20 MG/5ML suspension 30 mL  30 mL Oral Q4H PRN Leandrew Koyanagi, MD   30 mL at 10/23/16 0650  . bisacodyl (DULCOLAX) EC tablet 5 mg  5 mg Oral Daily PRN Opyd, Ilene Qua, MD      . Chlorhexidine Gluconate Cloth 2 % PADS 6 each  6 each Topical Daily Cinde Ebert L, MD   6 each at 10/24/16 1000  . docusate sodium (COLACE) capsule 100 mg  100 mg Oral BID Opyd, Christia Reading  S, MD   100 mg at 10/24/16 0918  . donepezil (ARICEPT) tablet 5 mg  5 mg Oral QHS Opyd, Ilene Qua, MD   5 mg at 10/23/16 2243  . enoxaparin (LOVENOX) injection 40 mg  40 mg Subcutaneous Q24H Leandrew Koyanagi, MD   40 mg at 10/24/16 0865  . hydrALAZINE (APRESOLINE) injection 10 mg  10 mg Intravenous Q4H PRN Opyd, Ilene Qua, MD      . HYDROcodone-acetaminophen (NORCO/VICODIN) 5-325 MG per tablet 1-2 tablet  1-2 tablet Oral Q6H PRN Opyd, Ilene Qua, MD   2 tablet at 10/24/16 [phone]  . levothyroxine (SYNTHROID, LEVOTHROID) tablet 50 mcg  50 mcg Oral QAC breakfast Opyd, Ilene Qua, MD   50 mcg at 10/24/16 0917  . loratadine (CLARITIN) tablet 10 mg   10 mg Oral Daily Opyd, Ilene Qua, MD   10 mg at 10/24/16 0918  . LORazepam (ATIVAN) injection 0.5 mg  0.5 mg Intravenous Q2H PRN Anslie Spadafora L, MD      . LORazepam (ATIVAN) tablet 0.5 mg  0.5 mg Oral BID Opyd, Ilene Qua, MD   0.5 mg at 10/24/16 0917  . memantine (NAMENDA) tablet 10 mg  10 mg Oral BID Vianne Bulls, MD   10 mg at 10/24/16 0918  . menthol-cetylpyridinium (CEPACOL) lozenge 3 mg  1 lozenge Oral PRN Leandrew Koyanagi, MD       Or  . phenol (CHLORASEPTIC) mouth spray 1 spray  1 spray Mouth/Throat PRN Leandrew Koyanagi, MD      . methocarbamol (ROBAXIN) tablet 500 mg  500 mg Oral Q6H PRN Leandrew Koyanagi, MD       Or  . methocarbamol (ROBAXIN) 500 mg in dextrose 5 % 50 mL IVPB  500 mg Intravenous Q6H PRN Leandrew Koyanagi, MD      . metoCLOPramide (REGLAN) tablet 5-10 mg  5-10 mg Oral Q8H PRN Leandrew Koyanagi, MD       Or  . metoCLOPramide (REGLAN) injection 5-10 mg  5-10 mg Intravenous Q8H PRN Leandrew Koyanagi, MD      . morphine 4 MG/ML injection 0.52 mg  0.52 mg Intravenous Q2H PRN Leandrew Koyanagi, MD      . morphine 4 MG/ML injection 1 mg  1 mg Intravenous Q2H PRN Opyd, Ilene Qua, MD   1 mg at 10/24/16 0140  . mupirocin ointment (BACTROBAN) 2 % 1 application  1 application Nasal BID Wynetta Emery, Valiant Dills L, MD   1 application at 96/29/52 1000  . ondansetron (ZOFRAN) tablet 4 mg  4 mg Oral Q6H PRN Leandrew Koyanagi, MD       Or  . ondansetron [facility]) injection 4 mg  4 mg Intravenous Q6H PRN Leandrew Koyanagi, MD      . oxyCODONE (Oxy IR/ROXICODONE) immediate release tablet 5-10 mg  5-10 mg Oral Q4H PRN Leandrew Koyanagi, MD      . polyethylene glycol (MIRALAX / GLYCOLAX) packet 17 g  17 g Oral Daily PRN Opyd, Ilene Qua, MD      . povidone-iodine 10 % swab 2 application  2 application Topical Once Leandrew Koyanagi, MD      . pravastatin (PRAVACHOL) tablet 40 mg  40 mg Oral QHS Opyd, Ilene Qua, MD   40 mg at 10/23/16 2242  . simethicone (MYLICON) chewable tablet 80 mg  80 mg Oral QHS Opyd, Ilene Qua, MD   80 mg at  10/23/16 2243  . zolpidem (AMBIEN) tablet 5 mg  5 mg Oral QHS PRN Opyd, Ilene Qua, MD         Discharge Medications: Please see discharge summary for a list of discharge medications.  Relevant Imaging Results:  Relevant Lab Results:   Additional Information SS:[id number] [place], LCSW

## 2016-10-24 NOTE — Social Work (Signed)
CSW confirmed bed offer for [facility] per [place] in admissions.  CSW faxed clinicals to [facility].  CSW will f/u on insurance auth for dc today to SNF.

## 2016-10-24 NOTE — Social Work (Addendum)
CSW called [place] to f/u on [employer].  CSW was advised by customer service that clinicals have been received and they are in review status.   CSW will continue to f/u as SNF indicated that they wld need DC summary by 4pm today if patient coming to [facility] today or by 11am on Saturday 10/24/16.  CSW will continue to f/u.

## 2016-10-24 NOTE — NC FL2 (Signed)
[place] LEVEL OF CARE SCREENING TOOL     IDENTIFICATION  Patient Name: Patricia Shepherd Birthdate: Apr 16, 1928 Sex: female Admission Date (Current Location): 10/21/2016  [facility] and [place] Number:  Herbalist and Address:  The [place]. [facility], [place] [address], [place], [place] [address]      Provider Number: [id number]  Attending Physician Name and Address:  Murlean Iba, MD  Relative Name and Phone Number:       Current Level of Care: Hospital Recommended Level of Care: [place] Prior Approval Number:    Date Approved/Denied: 10/23/16 PASRR Number: [id number]   Discharge Plan: SNF    Current Diagnoses: Patient Active Problem List   Diagnosis Date Noted  . Seizures ([place]) 10/23/2016  . Closed displaced fracture of right femoral neck ([place]) 10/21/2016  . Moderate aortic valve insufficiency 10/21/2016  . Chronic diastolic CHF (congestive heart failure) ([place]) 10/21/2016  . Leukocytosis 10/21/2016  . Essential hypertension 06/02/2014  . Hypothyroidism 06/02/2014  . History of stroke 06/02/2014  . Dementia 06/01/2014    Orientation RESPIRATION BLADDER Height & Weight     Situation, Self  O2 (Nasal Cannula) Continent Weight: 140 lb (63.5 kg) Height:  5\' 4"  (162.6 cm)  BEHAVIORAL SYMPTOMS/MOOD NEUROLOGICAL BOWEL NUTRITION STATUS      Continent Diet (See DC Summary)  AMBULATORY STATUS COMMUNICATION OF NEEDS Skin   Extensive Assist Verbally Surgical wounds (Right Hip Closed Incision with Hydrocolloid Dressing)                       Personal Care Assistance Level of Assistance  Bathing, Feeding, Dressing Bathing Assistance: Maximum assistance Feeding assistance: Maximum assistance Dressing Assistance: Maximum assistance     Functional Limitations Info  Sight, Hearing, Speech Sight Info: Adequate Hearing Info: Adequate Speech Info: Adequate    SPECIAL CARE FACTORS FREQUENCY  PT (By licensed PT), OT  (By licensed OT)     PT Frequency: 5xweek OT Frequency: 5xweek            Contractures      Additional Factors Info  Code Status, Allergies, Psychotropic Code Status Info: Full Allergies Info: BEE VENOM  Psychotropic Info: Ativan         Current Medications (10/24/2016):  This is the current hospital active medication list Current Facility-Administered Medications  Medication Dose Route Frequency Provider Last Rate Last Dose  . 0.9 %  sodium chloride infusion   Intravenous Continuous Leandrew Koyanagi, MD   Stopped at 10/23/16 1100  . acetaminophen (TYLENOL) tablet 650 mg  650 mg Oral Q6H PRN Leandrew Koyanagi, MD   650 mg at 10/23/16 8144   Or  . acetaminophen (TYLENOL) suppository 650 mg  650 mg Rectal Q6H PRN Leandrew Koyanagi, MD      . acetaminophen (TYLENOL) tablet 650 mg  650 mg Oral Q6H PRN Johnson, Clanford L, MD   650 mg at 10/22/16 1047  . alum & mag hydroxide-simeth (MAALOX/MYLANTA) 200-200-20 MG/5ML suspension 30 mL  30 mL Oral Q4H PRN Leandrew Koyanagi, MD   30 mL at 10/23/16 0650  . bisacodyl (DULCOLAX) EC tablet 5 mg  5 mg Oral Daily PRN Opyd, Ilene Qua, MD      . Chlorhexidine Gluconate Cloth 2 % PADS 6 each  6 each Topical Daily Johnson, Clanford L, MD   6 each at 10/24/16 1000  . docusate sodium (COLACE) capsule 100 mg  100 mg Oral BID Opyd,  Ilene Qua, MD   100 mg at 10/24/16 2505  . donepezil (ARICEPT) tablet 5 mg  5 mg Oral QHS Opyd, Ilene Qua, MD   5 mg at 10/23/16 2243  . enoxaparin (LOVENOX) injection 40 mg  40 mg Subcutaneous Q24H Leandrew Koyanagi, MD   40 mg at 10/24/16 3976  . hydrALAZINE (APRESOLINE) injection 10 mg  10 mg Intravenous Q4H PRN Opyd, Ilene Qua, MD      . HYDROcodone-acetaminophen (NORCO/VICODIN) 5-325 MG per tablet 1-2 tablet  1-2 tablet Oral Q6H PRN Opyd, Ilene Qua, MD   2 tablet at 10/24/16 [phone]  . levothyroxine (SYNTHROID, LEVOTHROID) tablet 50 mcg  50 mcg Oral QAC breakfast Opyd, Ilene Qua, MD   50 mcg at 10/24/16 0917  . loratadine (CLARITIN) tablet  10 mg  10 mg Oral Daily Opyd, Ilene Qua, MD   10 mg at 10/24/16 0918  . LORazepam (ATIVAN) injection 0.5 mg  0.5 mg Intravenous Q2H PRN Johnson, Clanford L, MD      . LORazepam (ATIVAN) tablet 0.5 mg  0.5 mg Oral BID Opyd, Ilene Qua, MD   0.5 mg at 10/24/16 0917  . memantine (NAMENDA) tablet 10 mg  10 mg Oral BID Vianne Bulls, MD   10 mg at 10/24/16 0918  . menthol-cetylpyridinium (CEPACOL) lozenge 3 mg  1 lozenge Oral PRN Leandrew Koyanagi, MD       Or  . phenol (CHLORASEPTIC) mouth spray 1 spray  1 spray Mouth/Throat PRN Leandrew Koyanagi, MD      . methocarbamol (ROBAXIN) tablet 500 mg  500 mg Oral Q6H PRN Leandrew Koyanagi, MD       Or  . methocarbamol (ROBAXIN) 500 mg in dextrose 5 % 50 mL IVPB  500 mg Intravenous Q6H PRN Leandrew Koyanagi, MD      . metoCLOPramide (REGLAN) tablet 5-10 mg  5-10 mg Oral Q8H PRN Leandrew Koyanagi, MD       Or  . metoCLOPramide (REGLAN) injection 5-10 mg  5-10 mg Intravenous Q8H PRN Leandrew Koyanagi, MD      . morphine 4 MG/ML injection 0.52 mg  0.52 mg Intravenous Q2H PRN Leandrew Koyanagi, MD      . morphine 4 MG/ML injection 1 mg  1 mg Intravenous Q2H PRN Opyd, Ilene Qua, MD   1 mg at 10/24/16 0140  . mupirocin ointment (BACTROBAN) 2 % 1 application  1 application Nasal BID [place] Emery, Clanford L, MD   1 application at 93/79/02 1000  . ondansetron (ZOFRAN) tablet 4 mg  4 mg Oral Q6H PRN Leandrew Koyanagi, MD       Or  . ondansetron [facility]) injection 4 mg  4 mg Intravenous Q6H PRN Leandrew Koyanagi, MD      . oxyCODONE (Oxy IR/ROXICODONE) immediate release tablet 5-10 mg  5-10 mg Oral Q4H PRN Leandrew Koyanagi, MD      . polyethylene glycol (MIRALAX / GLYCOLAX) packet 17 g  17 g Oral Daily PRN Opyd, Ilene Qua, MD      . povidone-iodine 10 % swab 2 application  2 application Topical Once Leandrew Koyanagi, MD      . pravastatin (PRAVACHOL) tablet 40 mg  40 mg Oral QHS Opyd, Ilene Qua, MD   40 mg at 10/23/16 2242  . simethicone (MYLICON) chewable tablet 80 mg  80 mg Oral QHS Opyd, Ilene Qua, MD   80  mg at 10/23/16 2243  . zolpidem (AMBIEN) tablet 5  mg  5 mg Oral QHS PRN Opyd, Ilene Qua, MD         Discharge Medications: Please see discharge summary for a list of discharge medications.  Relevant Imaging Results:  Relevant Lab Results:   Additional Information SS:[id number] [place], LCSW

## 2016-10-25 DIAGNOSIS — E785 Hyperlipidemia, unspecified: Secondary | ICD-10-CM | POA: Diagnosis not present

## 2016-10-25 DIAGNOSIS — Z9181 History of falling: Secondary | ICD-10-CM | POA: Diagnosis not present

## 2016-10-25 DIAGNOSIS — S72001A Fracture of unspecified part of neck of right femur, initial encounter for closed fracture: Secondary | ICD-10-CM | POA: Diagnosis not present

## 2016-10-25 DIAGNOSIS — F419 Anxiety disorder, unspecified: Secondary | ICD-10-CM | POA: Diagnosis not present

## 2016-10-25 DIAGNOSIS — F0280 Dementia in other diseases classified elsewhere without behavioral disturbance: Secondary | ICD-10-CM | POA: Diagnosis not present

## 2016-10-25 DIAGNOSIS — I351 Nonrheumatic aortic (valve) insufficiency: Secondary | ICD-10-CM | POA: Diagnosis not present

## 2016-10-25 DIAGNOSIS — X58XXXD Exposure to other specified factors, subsequent encounter: Secondary | ICD-10-CM | POA: Diagnosis not present

## 2016-10-25 DIAGNOSIS — R262 Difficulty in walking, not elsewhere classified: Secondary | ICD-10-CM | POA: Diagnosis not present

## 2016-10-25 DIAGNOSIS — I5032 Chronic diastolic (congestive) heart failure: Secondary | ICD-10-CM | POA: Diagnosis not present

## 2016-10-25 DIAGNOSIS — G309 Alzheimer's disease, unspecified: Secondary | ICD-10-CM | POA: Diagnosis not present

## 2016-10-25 DIAGNOSIS — E039 Hypothyroidism, unspecified: Secondary | ICD-10-CM | POA: Diagnosis not present

## 2016-10-25 DIAGNOSIS — M6281 Muscle weakness (generalized): Secondary | ICD-10-CM | POA: Diagnosis not present

## 2016-10-25 DIAGNOSIS — I110 Hypertensive heart disease with heart failure: Secondary | ICD-10-CM | POA: Diagnosis not present

## 2016-10-25 DIAGNOSIS — S72001D Fracture of unspecified part of neck of right femur, subsequent encounter for closed fracture with routine healing: Secondary | ICD-10-CM | POA: Diagnosis not present

## 2016-10-25 LAB — CBC
HCT: 27.1 % — ABNORMAL LOW (ref 36.0–46.0)
Hemoglobin: 8.5 g/dL — ABNORMAL LOW (ref 12.0–15.0)
MCH: 30.1 pg (ref 26.0–34.0)
MCHC: 31.4 g/dL (ref 30.0–36.0)
MCV: 96.1 fL (ref 78.0–100.0)
Platelets: 179 10*3/uL (ref 150–400)
RBC: 2.82 MIL/uL — ABNORMAL LOW (ref 3.87–5.11)
RDW: 14.8 % (ref 11.5–15.5)
WBC: 12.6 10*3/uL — ABNORMAL HIGH (ref 4.0–10.5)

## 2016-10-25 NOTE — Social Work (Addendum)
Clinical Social Worker facilitated patient discharge including contacting patient family and facility to confirm patient discharge plans.  Clinical information faxed to facility and family agreeable with plan.  CSW arranged ambulance transport via [facility] to  [facility].  RN to call [phone] [facility] Nurse) report prior to discharge.  Clinical Social Worker will sign off for now as social work intervention is no longer needed. Please consult [place] again if new need arises.  Elissa Hefty, LCSW Clinical Social Worker [phone]

## 2016-10-25 NOTE — Social Work (Signed)
CSW received Insurance [id number], RVC 565 therapy minutes, updated notes needed from SNF on 10/27/16 and they should call [phone].  CSW will set up transport to [facility].

## 2016-10-25 NOTE — Clinical Social Work Placement (Signed)
   CLINICAL SOCIAL WORK PLACEMENT  NOTE  Date:  10/25/2016  Patient Details  Name: Patricia Shepherd MRN: [id number] Date of Birth: Jun 19, 1927  Clinical Social Work is seeking post-discharge placement for this patient at the [place] level of care (*CSW will initial, date and re-position this form in  chart as items are completed):  Yes   Patient/family provided with [place] Work Department's list of facilities offering this level of care within the geographic area requested by the patient (or if unable, by the patient's family).  Yes   Patient/family informed of their freedom to choose among providers that offer the needed level of care, that participate in Medicare, Medicaid or managed care program needed by the patient, have an available bed and are willing to accept the patient.  Yes   Patient/family informed of [facility]'s ownership interest in [facility] and [facility], as well as of the fact that they are under no obligation to receive care at these facilities.  PASRR submitted to EDS on       PASRR number received on 10/23/16     Existing PASRR number confirmed on 10/23/16     FL2 transmitted to all facilities in geographic area requested by pt/family on 10/23/16     FL2 transmitted to all facilities within larger geographic area on 10/23/16     Patient informed that his/her managed care company has contracts with or will negotiate with certain facilities, including the following:        Yes   Patient/family informed of bed offers received.  Patient chooses bed at [facility]     Physician recommends and patient chooses bed at      Patient to be transferred to [facility] on 10/25/16.  Patient to be transferred to facility by [facility]     Patient family notified on 10/25/16 of transfer.  Name of family member notified:  daughter, Jeannene Patella and [place] Please prepare priority discharge summary,  including medications, Please prepare prescriptions, Please sign FL2     Additional Comment:    _______________________________________________ Normajean [place], LCSW 10/25/2016, 8:57 AM

## 2016-10-25 NOTE — Progress Notes (Signed)
10/25/2016 9:34 AM  Pt seen and examined and stable for discharge to SNF today, updated DC summary.   Murvin Natal MD

## 2016-10-25 NOTE — Care Management Note (Signed)
Case Management Note  Patient Details  Name: Patricia Shepherd MRN: [id number] Date of Birth: [birth year]  Subjective/Objective:                 Patient with order to DC to SNF. No CM needs identified.   Action/Plan:   Expected Discharge Date:  10/24/16               Expected Discharge Plan:  [place]  In-House Referral:     Discharge planning Services  CM Consult  Post Acute Care Choice:    Choice offered to:     DME Arranged:    DME Agency:     HH Arranged:    [facility] Agency:     Status of Service:  Completed, signed off  If discussed at [employer], dates discussed:    Additional Comments:  Carles Collet, RN 10/25/2016, 9:18 AM

## 2016-11-04 ENCOUNTER — Encounter (INDEPENDENT_AMBULATORY_CARE_PROVIDER_SITE_OTHER): Payer: Self-pay | Admitting: Orthopaedic Surgery

## 2016-11-04 ENCOUNTER — Ambulatory Visit (INDEPENDENT_AMBULATORY_CARE_PROVIDER_SITE_OTHER): Payer: Medicare Other | Admitting: Orthopaedic Surgery

## 2016-11-04 ENCOUNTER — Ambulatory Visit (INDEPENDENT_AMBULATORY_CARE_PROVIDER_SITE_OTHER): Payer: Self-pay

## 2016-11-04 DIAGNOSIS — S72001A Fracture of unspecified part of neck of right femur, initial encounter for closed fracture: Secondary | ICD-10-CM

## 2016-11-04 DIAGNOSIS — Z96641 Presence of right artificial hip joint: Secondary | ICD-10-CM

## 2016-11-04 NOTE — Progress Notes (Signed)
Patricia Shepherd is 2 weeks status post right hip arthroplasty for femoral neck fracture. She is currently at a nursing home. She does have advanced dementia. She is not walking very much per her daughter. Her surgical incision has healed without any signs of infection. Range of motion of the hip is painless. Her x-rays show stable alignment of the prosthesis. Patient is progressing slowly mainly due to her Alzheimer's. She is stable from my standpoint. Recheck in 6 weeks. Continue aggressive physical therapy for mobilization

## 2016-12-19 ENCOUNTER — Ambulatory Visit (INDEPENDENT_AMBULATORY_CARE_PROVIDER_SITE_OTHER): Payer: Self-pay

## 2016-12-19 ENCOUNTER — Encounter (INDEPENDENT_AMBULATORY_CARE_PROVIDER_SITE_OTHER): Payer: Self-pay | Admitting: Orthopaedic Surgery

## 2016-12-19 ENCOUNTER — Ambulatory Visit (INDEPENDENT_AMBULATORY_CARE_PROVIDER_SITE_OTHER): Payer: Medicare Other | Admitting: Orthopaedic Surgery

## 2016-12-19 DIAGNOSIS — S72001A Fracture of unspecified part of neck of right femur, initial encounter for closed fracture: Secondary | ICD-10-CM | POA: Diagnosis not present

## 2016-12-19 NOTE — Progress Notes (Signed)
Patient is 6 weeks status post right partial hip replacement. She has severe dementia. She is permanently at a skilled nursing facility. She is doing physical therapy 6 times a week but she is failed to ambulate due to balance issues and weakness. She denies any pain. Her x-ray show stable alignment of the partial hip replacement. Her surgical scars fully healed. She has painless range of motion of her hip. Follow-up in 2 months for recheck. No x-rays needed.

## 2017-02-17 ENCOUNTER — Ambulatory Visit (INDEPENDENT_AMBULATORY_CARE_PROVIDER_SITE_OTHER): Payer: Medicare Other | Admitting: Orthopaedic Surgery

## 2017-06-05 DIAGNOSIS — R1312 Dysphagia, oropharyngeal phase: Secondary | ICD-10-CM | POA: Diagnosis not present

## 2017-06-05 DIAGNOSIS — M6281 Muscle weakness (generalized): Secondary | ICD-10-CM | POA: Diagnosis not present

## 2017-06-05 DIAGNOSIS — R296 Repeated falls: Secondary | ICD-10-CM | POA: Diagnosis not present

## 2017-06-08 DIAGNOSIS — R1312 Dysphagia, oropharyngeal phase: Secondary | ICD-10-CM | POA: Diagnosis not present

## 2017-06-08 DIAGNOSIS — M6281 Muscle weakness (generalized): Secondary | ICD-10-CM | POA: Diagnosis not present

## 2017-06-08 DIAGNOSIS — R296 Repeated falls: Secondary | ICD-10-CM | POA: Diagnosis not present

## 2017-06-09 DIAGNOSIS — R1312 Dysphagia, oropharyngeal phase: Secondary | ICD-10-CM | POA: Diagnosis not present

## 2017-06-09 DIAGNOSIS — R296 Repeated falls: Secondary | ICD-10-CM | POA: Diagnosis not present

## 2017-06-09 DIAGNOSIS — M6281 Muscle weakness (generalized): Secondary | ICD-10-CM | POA: Diagnosis not present

## 2017-06-10 DIAGNOSIS — M6281 Muscle weakness (generalized): Secondary | ICD-10-CM | POA: Diagnosis not present

## 2017-06-10 DIAGNOSIS — R296 Repeated falls: Secondary | ICD-10-CM | POA: Diagnosis not present

## 2017-06-10 DIAGNOSIS — R1312 Dysphagia, oropharyngeal phase: Secondary | ICD-10-CM | POA: Diagnosis not present

## 2017-06-11 DIAGNOSIS — R296 Repeated falls: Secondary | ICD-10-CM | POA: Diagnosis not present

## 2017-06-11 DIAGNOSIS — R1312 Dysphagia, oropharyngeal phase: Secondary | ICD-10-CM | POA: Diagnosis not present

## 2017-06-11 DIAGNOSIS — M6281 Muscle weakness (generalized): Secondary | ICD-10-CM | POA: Diagnosis not present

## 2017-06-12 DIAGNOSIS — R296 Repeated falls: Secondary | ICD-10-CM | POA: Diagnosis not present

## 2017-06-12 DIAGNOSIS — R1312 Dysphagia, oropharyngeal phase: Secondary | ICD-10-CM | POA: Diagnosis not present

## 2017-06-12 DIAGNOSIS — M6281 Muscle weakness (generalized): Secondary | ICD-10-CM | POA: Diagnosis not present

## 2017-06-15 DIAGNOSIS — R296 Repeated falls: Secondary | ICD-10-CM | POA: Diagnosis not present

## 2017-06-15 DIAGNOSIS — R1312 Dysphagia, oropharyngeal phase: Secondary | ICD-10-CM | POA: Diagnosis not present

## 2017-06-15 DIAGNOSIS — M6281 Muscle weakness (generalized): Secondary | ICD-10-CM | POA: Diagnosis not present

## 2017-06-16 DIAGNOSIS — R296 Repeated falls: Secondary | ICD-10-CM | POA: Diagnosis not present

## 2017-06-16 DIAGNOSIS — M6281 Muscle weakness (generalized): Secondary | ICD-10-CM | POA: Diagnosis not present

## 2017-06-16 DIAGNOSIS — R1312 Dysphagia, oropharyngeal phase: Secondary | ICD-10-CM | POA: Diagnosis not present

## 2017-06-17 DIAGNOSIS — M6281 Muscle weakness (generalized): Secondary | ICD-10-CM | POA: Diagnosis not present

## 2017-06-17 DIAGNOSIS — R1312 Dysphagia, oropharyngeal phase: Secondary | ICD-10-CM | POA: Diagnosis not present

## 2017-06-17 DIAGNOSIS — R296 Repeated falls: Secondary | ICD-10-CM | POA: Diagnosis not present

## 2017-06-18 DIAGNOSIS — R296 Repeated falls: Secondary | ICD-10-CM | POA: Diagnosis not present

## 2017-06-18 DIAGNOSIS — R1312 Dysphagia, oropharyngeal phase: Secondary | ICD-10-CM | POA: Diagnosis not present

## 2017-06-18 DIAGNOSIS — M6281 Muscle weakness (generalized): Secondary | ICD-10-CM | POA: Diagnosis not present

## 2017-06-19 DIAGNOSIS — R1312 Dysphagia, oropharyngeal phase: Secondary | ICD-10-CM | POA: Diagnosis not present

## 2017-06-19 DIAGNOSIS — M6281 Muscle weakness (generalized): Secondary | ICD-10-CM | POA: Diagnosis not present

## 2017-06-19 DIAGNOSIS — R296 Repeated falls: Secondary | ICD-10-CM | POA: Diagnosis not present

## 2017-06-22 DIAGNOSIS — M6281 Muscle weakness (generalized): Secondary | ICD-10-CM | POA: Diagnosis not present

## 2017-06-22 DIAGNOSIS — R296 Repeated falls: Secondary | ICD-10-CM | POA: Diagnosis not present

## 2017-06-22 DIAGNOSIS — R1312 Dysphagia, oropharyngeal phase: Secondary | ICD-10-CM | POA: Diagnosis not present

## 2017-06-23 DIAGNOSIS — R296 Repeated falls: Secondary | ICD-10-CM | POA: Diagnosis not present

## 2017-06-23 DIAGNOSIS — R1312 Dysphagia, oropharyngeal phase: Secondary | ICD-10-CM | POA: Diagnosis not present

## 2017-06-23 DIAGNOSIS — M6281 Muscle weakness (generalized): Secondary | ICD-10-CM | POA: Diagnosis not present

## 2017-06-24 DIAGNOSIS — R1312 Dysphagia, oropharyngeal phase: Secondary | ICD-10-CM | POA: Diagnosis not present

## 2017-06-24 DIAGNOSIS — R296 Repeated falls: Secondary | ICD-10-CM | POA: Diagnosis not present

## 2017-06-24 DIAGNOSIS — M6281 Muscle weakness (generalized): Secondary | ICD-10-CM | POA: Diagnosis not present

## 2017-06-25 DIAGNOSIS — R296 Repeated falls: Secondary | ICD-10-CM | POA: Diagnosis not present

## 2017-06-25 DIAGNOSIS — R1312 Dysphagia, oropharyngeal phase: Secondary | ICD-10-CM | POA: Diagnosis not present

## 2017-06-25 DIAGNOSIS — M6281 Muscle weakness (generalized): Secondary | ICD-10-CM | POA: Diagnosis not present

## 2017-06-26 DIAGNOSIS — M6281 Muscle weakness (generalized): Secondary | ICD-10-CM | POA: Diagnosis not present

## 2017-06-26 DIAGNOSIS — R296 Repeated falls: Secondary | ICD-10-CM | POA: Diagnosis not present

## 2017-06-26 DIAGNOSIS — R1312 Dysphagia, oropharyngeal phase: Secondary | ICD-10-CM | POA: Diagnosis not present

## 2017-06-29 DIAGNOSIS — R296 Repeated falls: Secondary | ICD-10-CM | POA: Diagnosis not present

## 2017-06-29 DIAGNOSIS — R1312 Dysphagia, oropharyngeal phase: Secondary | ICD-10-CM | POA: Diagnosis not present

## 2017-06-29 DIAGNOSIS — M6281 Muscle weakness (generalized): Secondary | ICD-10-CM | POA: Diagnosis not present

## 2017-06-30 DIAGNOSIS — R296 Repeated falls: Secondary | ICD-10-CM | POA: Diagnosis not present

## 2017-06-30 DIAGNOSIS — R1312 Dysphagia, oropharyngeal phase: Secondary | ICD-10-CM | POA: Diagnosis not present

## 2017-06-30 DIAGNOSIS — M6281 Muscle weakness (generalized): Secondary | ICD-10-CM | POA: Diagnosis not present

## 2017-07-01 DIAGNOSIS — R296 Repeated falls: Secondary | ICD-10-CM | POA: Diagnosis not present

## 2017-07-01 DIAGNOSIS — R1312 Dysphagia, oropharyngeal phase: Secondary | ICD-10-CM | POA: Diagnosis not present

## 2017-07-01 DIAGNOSIS — M6281 Muscle weakness (generalized): Secondary | ICD-10-CM | POA: Diagnosis not present

## 2017-07-02 DIAGNOSIS — M6281 Muscle weakness (generalized): Secondary | ICD-10-CM | POA: Diagnosis not present

## 2017-07-02 DIAGNOSIS — R296 Repeated falls: Secondary | ICD-10-CM | POA: Diagnosis not present

## 2017-07-02 DIAGNOSIS — R1312 Dysphagia, oropharyngeal phase: Secondary | ICD-10-CM | POA: Diagnosis not present

## 2017-07-03 DIAGNOSIS — R1312 Dysphagia, oropharyngeal phase: Secondary | ICD-10-CM | POA: Diagnosis not present

## 2017-07-03 DIAGNOSIS — R296 Repeated falls: Secondary | ICD-10-CM | POA: Diagnosis not present

## 2017-07-06 DIAGNOSIS — R296 Repeated falls: Secondary | ICD-10-CM | POA: Diagnosis not present

## 2017-07-06 DIAGNOSIS — R1312 Dysphagia, oropharyngeal phase: Secondary | ICD-10-CM | POA: Diagnosis not present

## 2017-07-07 DIAGNOSIS — R296 Repeated falls: Secondary | ICD-10-CM | POA: Diagnosis not present

## 2017-07-07 DIAGNOSIS — R1312 Dysphagia, oropharyngeal phase: Secondary | ICD-10-CM | POA: Diagnosis not present

## 2017-07-08 DIAGNOSIS — R296 Repeated falls: Secondary | ICD-10-CM | POA: Diagnosis not present

## 2017-07-08 DIAGNOSIS — R1312 Dysphagia, oropharyngeal phase: Secondary | ICD-10-CM | POA: Diagnosis not present

## 2017-07-09 DIAGNOSIS — R296 Repeated falls: Secondary | ICD-10-CM | POA: Diagnosis not present

## 2017-07-09 DIAGNOSIS — R1312 Dysphagia, oropharyngeal phase: Secondary | ICD-10-CM | POA: Diagnosis not present

## 2017-07-10 DIAGNOSIS — R1312 Dysphagia, oropharyngeal phase: Secondary | ICD-10-CM | POA: Diagnosis not present

## 2017-07-10 DIAGNOSIS — R296 Repeated falls: Secondary | ICD-10-CM | POA: Diagnosis not present

## 2017-07-13 DIAGNOSIS — R1312 Dysphagia, oropharyngeal phase: Secondary | ICD-10-CM | POA: Diagnosis not present

## 2017-07-13 DIAGNOSIS — R296 Repeated falls: Secondary | ICD-10-CM | POA: Diagnosis not present

## 2017-07-14 DIAGNOSIS — R1312 Dysphagia, oropharyngeal phase: Secondary | ICD-10-CM | POA: Diagnosis not present

## 2017-07-14 DIAGNOSIS — R296 Repeated falls: Secondary | ICD-10-CM | POA: Diagnosis not present

## 2017-07-15 DIAGNOSIS — R296 Repeated falls: Secondary | ICD-10-CM | POA: Diagnosis not present

## 2017-07-15 DIAGNOSIS — R1312 Dysphagia, oropharyngeal phase: Secondary | ICD-10-CM | POA: Diagnosis not present

## 2017-07-16 DIAGNOSIS — R1312 Dysphagia, oropharyngeal phase: Secondary | ICD-10-CM | POA: Diagnosis not present

## 2017-07-16 DIAGNOSIS — R296 Repeated falls: Secondary | ICD-10-CM | POA: Diagnosis not present

## 2017-07-17 DIAGNOSIS — R296 Repeated falls: Secondary | ICD-10-CM | POA: Diagnosis not present

## 2017-07-17 DIAGNOSIS — R1312 Dysphagia, oropharyngeal phase: Secondary | ICD-10-CM | POA: Diagnosis not present

## 2017-07-20 DIAGNOSIS — R296 Repeated falls: Secondary | ICD-10-CM | POA: Diagnosis not present

## 2017-07-20 DIAGNOSIS — R1312 Dysphagia, oropharyngeal phase: Secondary | ICD-10-CM | POA: Diagnosis not present

## 2017-07-21 DIAGNOSIS — R296 Repeated falls: Secondary | ICD-10-CM | POA: Diagnosis not present

## 2017-07-21 DIAGNOSIS — R1312 Dysphagia, oropharyngeal phase: Secondary | ICD-10-CM | POA: Diagnosis not present

## 2017-07-22 DIAGNOSIS — R296 Repeated falls: Secondary | ICD-10-CM | POA: Diagnosis not present

## 2017-07-22 DIAGNOSIS — R1312 Dysphagia, oropharyngeal phase: Secondary | ICD-10-CM | POA: Diagnosis not present

## 2017-07-23 DIAGNOSIS — R296 Repeated falls: Secondary | ICD-10-CM | POA: Diagnosis not present

## 2017-07-23 DIAGNOSIS — R1312 Dysphagia, oropharyngeal phase: Secondary | ICD-10-CM | POA: Diagnosis not present

## 2017-07-24 DIAGNOSIS — R1312 Dysphagia, oropharyngeal phase: Secondary | ICD-10-CM | POA: Diagnosis not present

## 2017-07-24 DIAGNOSIS — R296 Repeated falls: Secondary | ICD-10-CM | POA: Diagnosis not present

## 2017-07-27 DIAGNOSIS — R296 Repeated falls: Secondary | ICD-10-CM | POA: Diagnosis not present

## 2017-07-27 DIAGNOSIS — R1312 Dysphagia, oropharyngeal phase: Secondary | ICD-10-CM | POA: Diagnosis not present

## 2017-07-28 DIAGNOSIS — R1312 Dysphagia, oropharyngeal phase: Secondary | ICD-10-CM | POA: Diagnosis not present

## 2017-07-28 DIAGNOSIS — C44319 Basal cell carcinoma of skin of other parts of face: Secondary | ICD-10-CM | POA: Diagnosis not present

## 2017-07-28 DIAGNOSIS — R296 Repeated falls: Secondary | ICD-10-CM | POA: Diagnosis not present

## 2017-07-29 DIAGNOSIS — R1312 Dysphagia, oropharyngeal phase: Secondary | ICD-10-CM | POA: Diagnosis not present

## 2017-07-29 DIAGNOSIS — R296 Repeated falls: Secondary | ICD-10-CM | POA: Diagnosis not present

## 2017-07-30 DIAGNOSIS — R296 Repeated falls: Secondary | ICD-10-CM | POA: Diagnosis not present

## 2017-07-30 DIAGNOSIS — R1312 Dysphagia, oropharyngeal phase: Secondary | ICD-10-CM | POA: Diagnosis not present

## 2017-07-31 DIAGNOSIS — R1312 Dysphagia, oropharyngeal phase: Secondary | ICD-10-CM | POA: Diagnosis not present

## 2017-07-31 DIAGNOSIS — R296 Repeated falls: Secondary | ICD-10-CM | POA: Diagnosis not present

## 2017-08-03 DIAGNOSIS — R296 Repeated falls: Secondary | ICD-10-CM | POA: Diagnosis not present

## 2017-08-04 DIAGNOSIS — R296 Repeated falls: Secondary | ICD-10-CM | POA: Diagnosis not present

## 2017-08-05 DIAGNOSIS — R296 Repeated falls: Secondary | ICD-10-CM | POA: Diagnosis not present

## 2017-08-06 DIAGNOSIS — R296 Repeated falls: Secondary | ICD-10-CM | POA: Diagnosis not present

## 2017-08-11 DIAGNOSIS — C44319 Basal cell carcinoma of skin of other parts of face: Secondary | ICD-10-CM | POA: Diagnosis not present

## 2017-09-02 DIAGNOSIS — E038 Other specified hypothyroidism: Secondary | ICD-10-CM | POA: Diagnosis not present

## 2017-09-02 DIAGNOSIS — F0280 Dementia in other diseases classified elsewhere without behavioral disturbance: Secondary | ICD-10-CM | POA: Diagnosis not present

## 2017-09-02 DIAGNOSIS — I10 Essential (primary) hypertension: Secondary | ICD-10-CM | POA: Diagnosis not present

## 2017-09-08 DIAGNOSIS — R262 Difficulty in walking, not elsewhere classified: Secondary | ICD-10-CM | POA: Diagnosis not present

## 2017-09-08 DIAGNOSIS — I739 Peripheral vascular disease, unspecified: Secondary | ICD-10-CM | POA: Diagnosis not present

## 2017-09-08 DIAGNOSIS — B351 Tinea unguium: Secondary | ICD-10-CM | POA: Diagnosis not present

## 2017-09-10 DIAGNOSIS — C44319 Basal cell carcinoma of skin of other parts of face: Secondary | ICD-10-CM | POA: Diagnosis not present

## 2017-09-22 DIAGNOSIS — M6281 Muscle weakness (generalized): Secondary | ICD-10-CM | POA: Diagnosis not present

## 2017-09-22 DIAGNOSIS — R2689 Other abnormalities of gait and mobility: Secondary | ICD-10-CM | POA: Diagnosis not present

## 2017-09-23 DIAGNOSIS — R2689 Other abnormalities of gait and mobility: Secondary | ICD-10-CM | POA: Diagnosis not present

## 2017-09-23 DIAGNOSIS — M6281 Muscle weakness (generalized): Secondary | ICD-10-CM | POA: Diagnosis not present

## 2017-09-24 DIAGNOSIS — M6281 Muscle weakness (generalized): Secondary | ICD-10-CM | POA: Diagnosis not present

## 2017-09-24 DIAGNOSIS — R2689 Other abnormalities of gait and mobility: Secondary | ICD-10-CM | POA: Diagnosis not present

## 2017-09-25 DIAGNOSIS — R2689 Other abnormalities of gait and mobility: Secondary | ICD-10-CM | POA: Diagnosis not present

## 2017-09-25 DIAGNOSIS — M6281 Muscle weakness (generalized): Secondary | ICD-10-CM | POA: Diagnosis not present

## 2017-09-28 DIAGNOSIS — M6281 Muscle weakness (generalized): Secondary | ICD-10-CM | POA: Diagnosis not present

## 2017-09-28 DIAGNOSIS — R2689 Other abnormalities of gait and mobility: Secondary | ICD-10-CM | POA: Diagnosis not present

## 2017-09-29 DIAGNOSIS — M6281 Muscle weakness (generalized): Secondary | ICD-10-CM | POA: Diagnosis not present

## 2017-09-29 DIAGNOSIS — R2689 Other abnormalities of gait and mobility: Secondary | ICD-10-CM | POA: Diagnosis not present

## 2017-09-30 DIAGNOSIS — R2689 Other abnormalities of gait and mobility: Secondary | ICD-10-CM | POA: Diagnosis not present

## 2017-09-30 DIAGNOSIS — M6281 Muscle weakness (generalized): Secondary | ICD-10-CM | POA: Diagnosis not present

## 2017-10-01 DIAGNOSIS — R2689 Other abnormalities of gait and mobility: Secondary | ICD-10-CM | POA: Diagnosis not present

## 2017-10-01 DIAGNOSIS — M6281 Muscle weakness (generalized): Secondary | ICD-10-CM | POA: Diagnosis not present

## 2017-10-02 DIAGNOSIS — R2689 Other abnormalities of gait and mobility: Secondary | ICD-10-CM | POA: Diagnosis not present

## 2017-10-02 DIAGNOSIS — M6281 Muscle weakness (generalized): Secondary | ICD-10-CM | POA: Diagnosis not present

## 2017-10-05 DIAGNOSIS — R2689 Other abnormalities of gait and mobility: Secondary | ICD-10-CM | POA: Diagnosis not present

## 2017-10-05 DIAGNOSIS — M6281 Muscle weakness (generalized): Secondary | ICD-10-CM | POA: Diagnosis not present

## 2017-10-06 DIAGNOSIS — M6281 Muscle weakness (generalized): Secondary | ICD-10-CM | POA: Diagnosis not present

## 2017-10-06 DIAGNOSIS — R2689 Other abnormalities of gait and mobility: Secondary | ICD-10-CM | POA: Diagnosis not present

## 2017-10-07 DIAGNOSIS — R2689 Other abnormalities of gait and mobility: Secondary | ICD-10-CM | POA: Diagnosis not present

## 2017-10-07 DIAGNOSIS — M6281 Muscle weakness (generalized): Secondary | ICD-10-CM | POA: Diagnosis not present

## 2017-10-08 DIAGNOSIS — M6281 Muscle weakness (generalized): Secondary | ICD-10-CM | POA: Diagnosis not present

## 2017-10-08 DIAGNOSIS — R2689 Other abnormalities of gait and mobility: Secondary | ICD-10-CM | POA: Diagnosis not present

## 2017-10-09 DIAGNOSIS — M6281 Muscle weakness (generalized): Secondary | ICD-10-CM | POA: Diagnosis not present

## 2017-10-09 DIAGNOSIS — R2689 Other abnormalities of gait and mobility: Secondary | ICD-10-CM | POA: Diagnosis not present

## 2017-10-12 DIAGNOSIS — R2689 Other abnormalities of gait and mobility: Secondary | ICD-10-CM | POA: Diagnosis not present

## 2017-10-12 DIAGNOSIS — M6281 Muscle weakness (generalized): Secondary | ICD-10-CM | POA: Diagnosis not present

## 2017-10-13 DIAGNOSIS — M6281 Muscle weakness (generalized): Secondary | ICD-10-CM | POA: Diagnosis not present

## 2017-10-13 DIAGNOSIS — R2689 Other abnormalities of gait and mobility: Secondary | ICD-10-CM | POA: Diagnosis not present

## 2017-10-14 DIAGNOSIS — M6281 Muscle weakness (generalized): Secondary | ICD-10-CM | POA: Diagnosis not present

## 2017-10-14 DIAGNOSIS — R2689 Other abnormalities of gait and mobility: Secondary | ICD-10-CM | POA: Diagnosis not present

## 2017-10-15 DIAGNOSIS — R2689 Other abnormalities of gait and mobility: Secondary | ICD-10-CM | POA: Diagnosis not present

## 2017-10-15 DIAGNOSIS — M6281 Muscle weakness (generalized): Secondary | ICD-10-CM | POA: Diagnosis not present

## 2017-10-16 DIAGNOSIS — R2689 Other abnormalities of gait and mobility: Secondary | ICD-10-CM | POA: Diagnosis not present

## 2017-10-16 DIAGNOSIS — I10 Essential (primary) hypertension: Secondary | ICD-10-CM | POA: Diagnosis not present

## 2017-10-16 DIAGNOSIS — M6281 Muscle weakness (generalized): Secondary | ICD-10-CM | POA: Diagnosis not present

## 2017-10-16 DIAGNOSIS — E038 Other specified hypothyroidism: Secondary | ICD-10-CM | POA: Diagnosis not present

## 2017-10-16 DIAGNOSIS — F0280 Dementia in other diseases classified elsewhere without behavioral disturbance: Secondary | ICD-10-CM | POA: Diagnosis not present

## 2017-10-16 DIAGNOSIS — I5032 Chronic diastolic (congestive) heart failure: Secondary | ICD-10-CM | POA: Diagnosis not present

## 2017-10-19 DIAGNOSIS — M6281 Muscle weakness (generalized): Secondary | ICD-10-CM | POA: Diagnosis not present

## 2017-10-19 DIAGNOSIS — R2689 Other abnormalities of gait and mobility: Secondary | ICD-10-CM | POA: Diagnosis not present

## 2017-10-20 DIAGNOSIS — M6281 Muscle weakness (generalized): Secondary | ICD-10-CM | POA: Diagnosis not present

## 2017-10-20 DIAGNOSIS — R2689 Other abnormalities of gait and mobility: Secondary | ICD-10-CM | POA: Diagnosis not present

## 2017-10-21 DIAGNOSIS — M6281 Muscle weakness (generalized): Secondary | ICD-10-CM | POA: Diagnosis not present

## 2017-10-21 DIAGNOSIS — R2689 Other abnormalities of gait and mobility: Secondary | ICD-10-CM | POA: Diagnosis not present

## 2017-10-22 DIAGNOSIS — R2689 Other abnormalities of gait and mobility: Secondary | ICD-10-CM | POA: Diagnosis not present

## 2017-10-22 DIAGNOSIS — M6281 Muscle weakness (generalized): Secondary | ICD-10-CM | POA: Diagnosis not present

## 2017-10-23 DIAGNOSIS — R2689 Other abnormalities of gait and mobility: Secondary | ICD-10-CM | POA: Diagnosis not present

## 2017-10-23 DIAGNOSIS — M6281 Muscle weakness (generalized): Secondary | ICD-10-CM | POA: Diagnosis not present

## 2017-10-26 DIAGNOSIS — R2689 Other abnormalities of gait and mobility: Secondary | ICD-10-CM | POA: Diagnosis not present

## 2017-10-26 DIAGNOSIS — M6281 Muscle weakness (generalized): Secondary | ICD-10-CM | POA: Diagnosis not present

## 2017-10-27 DIAGNOSIS — R2689 Other abnormalities of gait and mobility: Secondary | ICD-10-CM | POA: Diagnosis not present

## 2017-10-27 DIAGNOSIS — M6281 Muscle weakness (generalized): Secondary | ICD-10-CM | POA: Diagnosis not present

## 2017-10-28 DIAGNOSIS — M6281 Muscle weakness (generalized): Secondary | ICD-10-CM | POA: Diagnosis not present

## 2017-10-28 DIAGNOSIS — R2689 Other abnormalities of gait and mobility: Secondary | ICD-10-CM | POA: Diagnosis not present

## 2017-10-29 DIAGNOSIS — R2689 Other abnormalities of gait and mobility: Secondary | ICD-10-CM | POA: Diagnosis not present

## 2017-10-29 DIAGNOSIS — M6281 Muscle weakness (generalized): Secondary | ICD-10-CM | POA: Diagnosis not present

## 2017-10-30 DIAGNOSIS — M6281 Muscle weakness (generalized): Secondary | ICD-10-CM | POA: Diagnosis not present

## 2017-10-30 DIAGNOSIS — R2689 Other abnormalities of gait and mobility: Secondary | ICD-10-CM | POA: Diagnosis not present

## 2017-11-02 DIAGNOSIS — R2689 Other abnormalities of gait and mobility: Secondary | ICD-10-CM | POA: Diagnosis not present

## 2017-11-02 DIAGNOSIS — M6281 Muscle weakness (generalized): Secondary | ICD-10-CM | POA: Diagnosis not present

## 2017-11-03 DIAGNOSIS — R2689 Other abnormalities of gait and mobility: Secondary | ICD-10-CM | POA: Diagnosis not present

## 2017-11-03 DIAGNOSIS — M6281 Muscle weakness (generalized): Secondary | ICD-10-CM | POA: Diagnosis not present

## 2017-11-04 DIAGNOSIS — M6281 Muscle weakness (generalized): Secondary | ICD-10-CM | POA: Diagnosis not present

## 2017-11-04 DIAGNOSIS — R2689 Other abnormalities of gait and mobility: Secondary | ICD-10-CM | POA: Diagnosis not present

## 2017-11-05 DIAGNOSIS — R2689 Other abnormalities of gait and mobility: Secondary | ICD-10-CM | POA: Diagnosis not present

## 2017-11-05 DIAGNOSIS — M6281 Muscle weakness (generalized): Secondary | ICD-10-CM | POA: Diagnosis not present

## 2017-11-06 DIAGNOSIS — M6281 Muscle weakness (generalized): Secondary | ICD-10-CM | POA: Diagnosis not present

## 2017-11-06 DIAGNOSIS — R2689 Other abnormalities of gait and mobility: Secondary | ICD-10-CM | POA: Diagnosis not present

## 2017-11-09 DIAGNOSIS — M6281 Muscle weakness (generalized): Secondary | ICD-10-CM | POA: Diagnosis not present

## 2017-11-09 DIAGNOSIS — R2689 Other abnormalities of gait and mobility: Secondary | ICD-10-CM | POA: Diagnosis not present

## 2017-11-10 DIAGNOSIS — R2689 Other abnormalities of gait and mobility: Secondary | ICD-10-CM | POA: Diagnosis not present

## 2017-11-10 DIAGNOSIS — M6281 Muscle weakness (generalized): Secondary | ICD-10-CM | POA: Diagnosis not present

## 2017-11-11 DIAGNOSIS — M6281 Muscle weakness (generalized): Secondary | ICD-10-CM | POA: Diagnosis not present

## 2017-11-11 DIAGNOSIS — R2689 Other abnormalities of gait and mobility: Secondary | ICD-10-CM | POA: Diagnosis not present

## 2017-11-12 DIAGNOSIS — M6281 Muscle weakness (generalized): Secondary | ICD-10-CM | POA: Diagnosis not present

## 2017-11-12 DIAGNOSIS — R2689 Other abnormalities of gait and mobility: Secondary | ICD-10-CM | POA: Diagnosis not present

## 2017-11-13 DIAGNOSIS — R2689 Other abnormalities of gait and mobility: Secondary | ICD-10-CM | POA: Diagnosis not present

## 2017-11-13 DIAGNOSIS — M6281 Muscle weakness (generalized): Secondary | ICD-10-CM | POA: Diagnosis not present

## 2017-11-16 DIAGNOSIS — R2689 Other abnormalities of gait and mobility: Secondary | ICD-10-CM | POA: Diagnosis not present

## 2017-11-16 DIAGNOSIS — M6281 Muscle weakness (generalized): Secondary | ICD-10-CM | POA: Diagnosis not present

## 2017-11-17 DIAGNOSIS — M6281 Muscle weakness (generalized): Secondary | ICD-10-CM | POA: Diagnosis not present

## 2017-11-17 DIAGNOSIS — R2689 Other abnormalities of gait and mobility: Secondary | ICD-10-CM | POA: Diagnosis not present

## 2017-11-18 DIAGNOSIS — M6281 Muscle weakness (generalized): Secondary | ICD-10-CM | POA: Diagnosis not present

## 2017-11-18 DIAGNOSIS — R2689 Other abnormalities of gait and mobility: Secondary | ICD-10-CM | POA: Diagnosis not present

## 2017-11-19 DIAGNOSIS — M6281 Muscle weakness (generalized): Secondary | ICD-10-CM | POA: Diagnosis not present

## 2017-11-19 DIAGNOSIS — R2689 Other abnormalities of gait and mobility: Secondary | ICD-10-CM | POA: Diagnosis not present

## 2017-11-20 DIAGNOSIS — M6281 Muscle weakness (generalized): Secondary | ICD-10-CM | POA: Diagnosis not present

## 2017-11-20 DIAGNOSIS — R2689 Other abnormalities of gait and mobility: Secondary | ICD-10-CM | POA: Diagnosis not present

## 2017-11-21 DIAGNOSIS — I10 Essential (primary) hypertension: Secondary | ICD-10-CM | POA: Diagnosis not present

## 2017-11-21 DIAGNOSIS — I5032 Chronic diastolic (congestive) heart failure: Secondary | ICD-10-CM | POA: Diagnosis not present

## 2017-11-21 DIAGNOSIS — E038 Other specified hypothyroidism: Secondary | ICD-10-CM | POA: Diagnosis not present

## 2017-11-21 DIAGNOSIS — F0280 Dementia in other diseases classified elsewhere without behavioral disturbance: Secondary | ICD-10-CM | POA: Diagnosis not present

## 2017-11-23 DIAGNOSIS — M6281 Muscle weakness (generalized): Secondary | ICD-10-CM | POA: Diagnosis not present

## 2017-11-23 DIAGNOSIS — R2689 Other abnormalities of gait and mobility: Secondary | ICD-10-CM | POA: Diagnosis not present

## 2017-11-24 DIAGNOSIS — M6281 Muscle weakness (generalized): Secondary | ICD-10-CM | POA: Diagnosis not present

## 2017-11-24 DIAGNOSIS — R2689 Other abnormalities of gait and mobility: Secondary | ICD-10-CM | POA: Diagnosis not present

## 2017-11-25 DIAGNOSIS — R2689 Other abnormalities of gait and mobility: Secondary | ICD-10-CM | POA: Diagnosis not present

## 2017-11-25 DIAGNOSIS — M6281 Muscle weakness (generalized): Secondary | ICD-10-CM | POA: Diagnosis not present

## 2017-11-26 DIAGNOSIS — R2689 Other abnormalities of gait and mobility: Secondary | ICD-10-CM | POA: Diagnosis not present

## 2017-11-26 DIAGNOSIS — M6281 Muscle weakness (generalized): Secondary | ICD-10-CM | POA: Diagnosis not present

## 2017-11-27 DIAGNOSIS — M6281 Muscle weakness (generalized): Secondary | ICD-10-CM | POA: Diagnosis not present

## 2017-11-27 DIAGNOSIS — R2689 Other abnormalities of gait and mobility: Secondary | ICD-10-CM | POA: Diagnosis not present

## 2017-11-30 DIAGNOSIS — M6281 Muscle weakness (generalized): Secondary | ICD-10-CM | POA: Diagnosis not present

## 2017-11-30 DIAGNOSIS — R2689 Other abnormalities of gait and mobility: Secondary | ICD-10-CM | POA: Diagnosis not present

## 2017-12-01 DIAGNOSIS — R2689 Other abnormalities of gait and mobility: Secondary | ICD-10-CM | POA: Diagnosis not present

## 2017-12-01 DIAGNOSIS — M6281 Muscle weakness (generalized): Secondary | ICD-10-CM | POA: Diagnosis not present

## 2017-12-02 DIAGNOSIS — R2689 Other abnormalities of gait and mobility: Secondary | ICD-10-CM | POA: Diagnosis not present

## 2017-12-02 DIAGNOSIS — M6281 Muscle weakness (generalized): Secondary | ICD-10-CM | POA: Diagnosis not present

## 2017-12-03 DIAGNOSIS — Z5181 Encounter for therapeutic drug level monitoring: Secondary | ICD-10-CM | POA: Diagnosis not present

## 2017-12-03 DIAGNOSIS — Z79899 Other long term (current) drug therapy: Secondary | ICD-10-CM | POA: Diagnosis not present

## 2017-12-03 DIAGNOSIS — R2689 Other abnormalities of gait and mobility: Secondary | ICD-10-CM | POA: Diagnosis not present

## 2017-12-03 DIAGNOSIS — M6281 Muscle weakness (generalized): Secondary | ICD-10-CM | POA: Diagnosis not present

## 2017-12-04 DIAGNOSIS — R2689 Other abnormalities of gait and mobility: Secondary | ICD-10-CM | POA: Diagnosis not present

## 2017-12-04 DIAGNOSIS — M6281 Muscle weakness (generalized): Secondary | ICD-10-CM | POA: Diagnosis not present

## 2017-12-07 DIAGNOSIS — M6281 Muscle weakness (generalized): Secondary | ICD-10-CM | POA: Diagnosis not present

## 2017-12-07 DIAGNOSIS — R2689 Other abnormalities of gait and mobility: Secondary | ICD-10-CM | POA: Diagnosis not present

## 2017-12-08 DIAGNOSIS — R2689 Other abnormalities of gait and mobility: Secondary | ICD-10-CM | POA: Diagnosis not present

## 2017-12-08 DIAGNOSIS — M6281 Muscle weakness (generalized): Secondary | ICD-10-CM | POA: Diagnosis not present

## 2017-12-09 DIAGNOSIS — R2689 Other abnormalities of gait and mobility: Secondary | ICD-10-CM | POA: Diagnosis not present

## 2017-12-09 DIAGNOSIS — M6281 Muscle weakness (generalized): Secondary | ICD-10-CM | POA: Diagnosis not present

## 2017-12-10 DIAGNOSIS — M6281 Muscle weakness (generalized): Secondary | ICD-10-CM | POA: Diagnosis not present

## 2017-12-10 DIAGNOSIS — R2689 Other abnormalities of gait and mobility: Secondary | ICD-10-CM | POA: Diagnosis not present

## 2017-12-11 DIAGNOSIS — M6281 Muscle weakness (generalized): Secondary | ICD-10-CM | POA: Diagnosis not present

## 2017-12-11 DIAGNOSIS — R2689 Other abnormalities of gait and mobility: Secondary | ICD-10-CM | POA: Diagnosis not present

## 2017-12-14 DIAGNOSIS — M6281 Muscle weakness (generalized): Secondary | ICD-10-CM | POA: Diagnosis not present

## 2017-12-14 DIAGNOSIS — R2689 Other abnormalities of gait and mobility: Secondary | ICD-10-CM | POA: Diagnosis not present

## 2018-01-07 DIAGNOSIS — I739 Peripheral vascular disease, unspecified: Secondary | ICD-10-CM | POA: Diagnosis not present

## 2018-01-07 DIAGNOSIS — L603 Nail dystrophy: Secondary | ICD-10-CM | POA: Diagnosis not present

## 2018-01-08 DIAGNOSIS — I5032 Chronic diastolic (congestive) heart failure: Secondary | ICD-10-CM | POA: Diagnosis not present

## 2018-01-08 DIAGNOSIS — E038 Other specified hypothyroidism: Secondary | ICD-10-CM | POA: Diagnosis not present

## 2018-01-08 DIAGNOSIS — F0280 Dementia in other diseases classified elsewhere without behavioral disturbance: Secondary | ICD-10-CM | POA: Diagnosis not present

## 2018-01-08 DIAGNOSIS — I10 Essential (primary) hypertension: Secondary | ICD-10-CM | POA: Diagnosis not present

## 2018-02-02 DIAGNOSIS — Z23 Encounter for immunization: Secondary | ICD-10-CM | POA: Diagnosis not present

## 2018-02-11 DIAGNOSIS — M6281 Muscle weakness (generalized): Secondary | ICD-10-CM | POA: Diagnosis not present

## 2018-02-11 DIAGNOSIS — R1311 Dysphagia, oral phase: Secondary | ICD-10-CM | POA: Diagnosis not present

## 2018-02-11 DIAGNOSIS — G309 Alzheimer's disease, unspecified: Secondary | ICD-10-CM | POA: Diagnosis not present

## 2018-02-12 DIAGNOSIS — R1311 Dysphagia, oral phase: Secondary | ICD-10-CM | POA: Diagnosis not present

## 2018-02-12 DIAGNOSIS — G309 Alzheimer's disease, unspecified: Secondary | ICD-10-CM | POA: Diagnosis not present

## 2018-02-12 DIAGNOSIS — M6281 Muscle weakness (generalized): Secondary | ICD-10-CM | POA: Diagnosis not present

## 2018-02-15 DIAGNOSIS — M6281 Muscle weakness (generalized): Secondary | ICD-10-CM | POA: Diagnosis not present

## 2018-02-15 DIAGNOSIS — G309 Alzheimer's disease, unspecified: Secondary | ICD-10-CM | POA: Diagnosis not present

## 2018-02-15 DIAGNOSIS — R1311 Dysphagia, oral phase: Secondary | ICD-10-CM | POA: Diagnosis not present

## 2018-02-16 DIAGNOSIS — R1311 Dysphagia, oral phase: Secondary | ICD-10-CM | POA: Diagnosis not present

## 2018-02-16 DIAGNOSIS — M6281 Muscle weakness (generalized): Secondary | ICD-10-CM | POA: Diagnosis not present

## 2018-02-16 DIAGNOSIS — G309 Alzheimer's disease, unspecified: Secondary | ICD-10-CM | POA: Diagnosis not present

## 2018-02-17 DIAGNOSIS — G309 Alzheimer's disease, unspecified: Secondary | ICD-10-CM | POA: Diagnosis not present

## 2018-02-17 DIAGNOSIS — R1311 Dysphagia, oral phase: Secondary | ICD-10-CM | POA: Diagnosis not present

## 2018-02-17 DIAGNOSIS — M6281 Muscle weakness (generalized): Secondary | ICD-10-CM | POA: Diagnosis not present

## 2018-02-18 DIAGNOSIS — I10 Essential (primary) hypertension: Secondary | ICD-10-CM | POA: Diagnosis not present

## 2018-02-18 DIAGNOSIS — M6281 Muscle weakness (generalized): Secondary | ICD-10-CM | POA: Diagnosis not present

## 2018-02-18 DIAGNOSIS — E038 Other specified hypothyroidism: Secondary | ICD-10-CM | POA: Diagnosis not present

## 2018-02-18 DIAGNOSIS — R1311 Dysphagia, oral phase: Secondary | ICD-10-CM | POA: Diagnosis not present

## 2018-02-18 DIAGNOSIS — F0280 Dementia in other diseases classified elsewhere without behavioral disturbance: Secondary | ICD-10-CM | POA: Diagnosis not present

## 2018-02-18 DIAGNOSIS — G309 Alzheimer's disease, unspecified: Secondary | ICD-10-CM | POA: Diagnosis not present

## 2018-02-18 DIAGNOSIS — I5032 Chronic diastolic (congestive) heart failure: Secondary | ICD-10-CM | POA: Diagnosis not present

## 2018-02-19 DIAGNOSIS — R1311 Dysphagia, oral phase: Secondary | ICD-10-CM | POA: Diagnosis not present

## 2018-02-19 DIAGNOSIS — M6281 Muscle weakness (generalized): Secondary | ICD-10-CM | POA: Diagnosis not present

## 2018-02-19 DIAGNOSIS — G309 Alzheimer's disease, unspecified: Secondary | ICD-10-CM | POA: Diagnosis not present

## 2018-02-20 DIAGNOSIS — R1311 Dysphagia, oral phase: Secondary | ICD-10-CM | POA: Diagnosis not present

## 2018-02-20 DIAGNOSIS — G309 Alzheimer's disease, unspecified: Secondary | ICD-10-CM | POA: Diagnosis not present

## 2018-02-20 DIAGNOSIS — M6281 Muscle weakness (generalized): Secondary | ICD-10-CM | POA: Diagnosis not present

## 2018-02-22 DIAGNOSIS — G309 Alzheimer's disease, unspecified: Secondary | ICD-10-CM | POA: Diagnosis not present

## 2018-02-22 DIAGNOSIS — R1311 Dysphagia, oral phase: Secondary | ICD-10-CM | POA: Diagnosis not present

## 2018-02-22 DIAGNOSIS — M6281 Muscle weakness (generalized): Secondary | ICD-10-CM | POA: Diagnosis not present

## 2018-02-23 DIAGNOSIS — G309 Alzheimer's disease, unspecified: Secondary | ICD-10-CM | POA: Diagnosis not present

## 2018-02-23 DIAGNOSIS — M6281 Muscle weakness (generalized): Secondary | ICD-10-CM | POA: Diagnosis not present

## 2018-02-23 DIAGNOSIS — R1311 Dysphagia, oral phase: Secondary | ICD-10-CM | POA: Diagnosis not present

## 2018-02-24 DIAGNOSIS — G309 Alzheimer's disease, unspecified: Secondary | ICD-10-CM | POA: Diagnosis not present

## 2018-02-24 DIAGNOSIS — R1311 Dysphagia, oral phase: Secondary | ICD-10-CM | POA: Diagnosis not present

## 2018-02-24 DIAGNOSIS — M6281 Muscle weakness (generalized): Secondary | ICD-10-CM | POA: Diagnosis not present

## 2018-02-25 DIAGNOSIS — R1311 Dysphagia, oral phase: Secondary | ICD-10-CM | POA: Diagnosis not present

## 2018-02-25 DIAGNOSIS — M6281 Muscle weakness (generalized): Secondary | ICD-10-CM | POA: Diagnosis not present

## 2018-02-25 DIAGNOSIS — G309 Alzheimer's disease, unspecified: Secondary | ICD-10-CM | POA: Diagnosis not present

## 2018-02-26 DIAGNOSIS — G309 Alzheimer's disease, unspecified: Secondary | ICD-10-CM | POA: Diagnosis not present

## 2018-02-26 DIAGNOSIS — R1311 Dysphagia, oral phase: Secondary | ICD-10-CM | POA: Diagnosis not present

## 2018-02-26 DIAGNOSIS — M6281 Muscle weakness (generalized): Secondary | ICD-10-CM | POA: Diagnosis not present

## 2018-02-27 DIAGNOSIS — R1311 Dysphagia, oral phase: Secondary | ICD-10-CM | POA: Diagnosis not present

## 2018-02-27 DIAGNOSIS — G309 Alzheimer's disease, unspecified: Secondary | ICD-10-CM | POA: Diagnosis not present

## 2018-02-27 DIAGNOSIS — M6281 Muscle weakness (generalized): Secondary | ICD-10-CM | POA: Diagnosis not present

## 2018-03-02 DIAGNOSIS — G309 Alzheimer's disease, unspecified: Secondary | ICD-10-CM | POA: Diagnosis not present

## 2018-03-02 DIAGNOSIS — M6281 Muscle weakness (generalized): Secondary | ICD-10-CM | POA: Diagnosis not present

## 2018-03-02 DIAGNOSIS — R1311 Dysphagia, oral phase: Secondary | ICD-10-CM | POA: Diagnosis not present

## 2018-03-03 DIAGNOSIS — M6281 Muscle weakness (generalized): Secondary | ICD-10-CM | POA: Diagnosis not present

## 2018-03-03 DIAGNOSIS — R1311 Dysphagia, oral phase: Secondary | ICD-10-CM | POA: Diagnosis not present

## 2018-03-03 DIAGNOSIS — G309 Alzheimer's disease, unspecified: Secondary | ICD-10-CM | POA: Diagnosis not present

## 2018-03-04 DIAGNOSIS — R1311 Dysphagia, oral phase: Secondary | ICD-10-CM | POA: Diagnosis not present

## 2018-03-04 DIAGNOSIS — M6281 Muscle weakness (generalized): Secondary | ICD-10-CM | POA: Diagnosis not present

## 2018-03-04 DIAGNOSIS — G309 Alzheimer's disease, unspecified: Secondary | ICD-10-CM | POA: Diagnosis not present

## 2018-03-05 DIAGNOSIS — G309 Alzheimer's disease, unspecified: Secondary | ICD-10-CM | POA: Diagnosis not present

## 2018-03-05 DIAGNOSIS — R1311 Dysphagia, oral phase: Secondary | ICD-10-CM | POA: Diagnosis not present

## 2018-03-05 DIAGNOSIS — M6281 Muscle weakness (generalized): Secondary | ICD-10-CM | POA: Diagnosis not present

## 2018-03-08 DIAGNOSIS — M6281 Muscle weakness (generalized): Secondary | ICD-10-CM | POA: Diagnosis not present

## 2018-03-08 DIAGNOSIS — R1311 Dysphagia, oral phase: Secondary | ICD-10-CM | POA: Diagnosis not present

## 2018-03-08 DIAGNOSIS — G309 Alzheimer's disease, unspecified: Secondary | ICD-10-CM | POA: Diagnosis not present

## 2018-03-09 DIAGNOSIS — M6281 Muscle weakness (generalized): Secondary | ICD-10-CM | POA: Diagnosis not present

## 2018-03-09 DIAGNOSIS — G309 Alzheimer's disease, unspecified: Secondary | ICD-10-CM | POA: Diagnosis not present

## 2018-03-09 DIAGNOSIS — R1311 Dysphagia, oral phase: Secondary | ICD-10-CM | POA: Diagnosis not present

## 2018-03-10 DIAGNOSIS — R1311 Dysphagia, oral phase: Secondary | ICD-10-CM | POA: Diagnosis not present

## 2018-03-10 DIAGNOSIS — M6281 Muscle weakness (generalized): Secondary | ICD-10-CM | POA: Diagnosis not present

## 2018-03-10 DIAGNOSIS — G309 Alzheimer's disease, unspecified: Secondary | ICD-10-CM | POA: Diagnosis not present

## 2018-03-11 DIAGNOSIS — M6281 Muscle weakness (generalized): Secondary | ICD-10-CM | POA: Diagnosis not present

## 2018-03-11 DIAGNOSIS — G309 Alzheimer's disease, unspecified: Secondary | ICD-10-CM | POA: Diagnosis not present

## 2018-03-11 DIAGNOSIS — R1311 Dysphagia, oral phase: Secondary | ICD-10-CM | POA: Diagnosis not present

## 2018-03-12 DIAGNOSIS — R1311 Dysphagia, oral phase: Secondary | ICD-10-CM | POA: Diagnosis not present

## 2018-03-12 DIAGNOSIS — G309 Alzheimer's disease, unspecified: Secondary | ICD-10-CM | POA: Diagnosis not present

## 2018-03-12 DIAGNOSIS — M6281 Muscle weakness (generalized): Secondary | ICD-10-CM | POA: Diagnosis not present

## 2018-03-15 DIAGNOSIS — R1311 Dysphagia, oral phase: Secondary | ICD-10-CM | POA: Diagnosis not present

## 2018-03-15 DIAGNOSIS — M6281 Muscle weakness (generalized): Secondary | ICD-10-CM | POA: Diagnosis not present

## 2018-03-15 DIAGNOSIS — G309 Alzheimer's disease, unspecified: Secondary | ICD-10-CM | POA: Diagnosis not present

## 2018-03-16 DIAGNOSIS — R1311 Dysphagia, oral phase: Secondary | ICD-10-CM | POA: Diagnosis not present

## 2018-03-16 DIAGNOSIS — M6281 Muscle weakness (generalized): Secondary | ICD-10-CM | POA: Diagnosis not present

## 2018-03-16 DIAGNOSIS — G309 Alzheimer's disease, unspecified: Secondary | ICD-10-CM | POA: Diagnosis not present

## 2018-03-17 DIAGNOSIS — M6281 Muscle weakness (generalized): Secondary | ICD-10-CM | POA: Diagnosis not present

## 2018-03-17 DIAGNOSIS — G309 Alzheimer's disease, unspecified: Secondary | ICD-10-CM | POA: Diagnosis not present

## 2018-03-17 DIAGNOSIS — R1311 Dysphagia, oral phase: Secondary | ICD-10-CM | POA: Diagnosis not present

## 2018-03-18 DIAGNOSIS — R1311 Dysphagia, oral phase: Secondary | ICD-10-CM | POA: Diagnosis not present

## 2018-03-18 DIAGNOSIS — G309 Alzheimer's disease, unspecified: Secondary | ICD-10-CM | POA: Diagnosis not present

## 2018-03-18 DIAGNOSIS — M6281 Muscle weakness (generalized): Secondary | ICD-10-CM | POA: Diagnosis not present

## 2018-03-19 DIAGNOSIS — R1311 Dysphagia, oral phase: Secondary | ICD-10-CM | POA: Diagnosis not present

## 2018-03-19 DIAGNOSIS — G309 Alzheimer's disease, unspecified: Secondary | ICD-10-CM | POA: Diagnosis not present

## 2018-03-19 DIAGNOSIS — M6281 Muscle weakness (generalized): Secondary | ICD-10-CM | POA: Diagnosis not present

## 2018-03-23 DIAGNOSIS — R1311 Dysphagia, oral phase: Secondary | ICD-10-CM | POA: Diagnosis not present

## 2018-03-23 DIAGNOSIS — G309 Alzheimer's disease, unspecified: Secondary | ICD-10-CM | POA: Diagnosis not present

## 2018-03-23 DIAGNOSIS — M6281 Muscle weakness (generalized): Secondary | ICD-10-CM | POA: Diagnosis not present

## 2018-03-24 DIAGNOSIS — G309 Alzheimer's disease, unspecified: Secondary | ICD-10-CM | POA: Diagnosis not present

## 2018-03-24 DIAGNOSIS — R1311 Dysphagia, oral phase: Secondary | ICD-10-CM | POA: Diagnosis not present

## 2018-03-24 DIAGNOSIS — M6281 Muscle weakness (generalized): Secondary | ICD-10-CM | POA: Diagnosis not present

## 2018-03-25 DIAGNOSIS — M6281 Muscle weakness (generalized): Secondary | ICD-10-CM | POA: Diagnosis not present

## 2018-03-25 DIAGNOSIS — G309 Alzheimer's disease, unspecified: Secondary | ICD-10-CM | POA: Diagnosis not present

## 2018-03-25 DIAGNOSIS — R1311 Dysphagia, oral phase: Secondary | ICD-10-CM | POA: Diagnosis not present

## 2018-03-30 DIAGNOSIS — M6281 Muscle weakness (generalized): Secondary | ICD-10-CM | POA: Diagnosis not present

## 2018-03-30 DIAGNOSIS — R1311 Dysphagia, oral phase: Secondary | ICD-10-CM | POA: Diagnosis not present

## 2018-03-30 DIAGNOSIS — G309 Alzheimer's disease, unspecified: Secondary | ICD-10-CM | POA: Diagnosis not present

## 2018-04-01 DIAGNOSIS — E038 Other specified hypothyroidism: Secondary | ICD-10-CM | POA: Diagnosis not present

## 2018-04-01 DIAGNOSIS — I5032 Chronic diastolic (congestive) heart failure: Secondary | ICD-10-CM | POA: Diagnosis not present

## 2018-04-01 DIAGNOSIS — I10 Essential (primary) hypertension: Secondary | ICD-10-CM | POA: Diagnosis not present

## 2018-04-01 DIAGNOSIS — F0280 Dementia in other diseases classified elsewhere without behavioral disturbance: Secondary | ICD-10-CM | POA: Diagnosis not present

## 2018-05-15 DIAGNOSIS — I10 Essential (primary) hypertension: Secondary | ICD-10-CM | POA: Diagnosis not present

## 2018-05-15 DIAGNOSIS — I5032 Chronic diastolic (congestive) heart failure: Secondary | ICD-10-CM | POA: Diagnosis not present

## 2018-05-15 DIAGNOSIS — F0280 Dementia in other diseases classified elsewhere without behavioral disturbance: Secondary | ICD-10-CM | POA: Diagnosis not present

## 2018-05-15 DIAGNOSIS — E038 Other specified hypothyroidism: Secondary | ICD-10-CM | POA: Diagnosis not present

## 2018-06-30 DIAGNOSIS — C4482 Squamous cell carcinoma of overlapping sites of skin: Secondary | ICD-10-CM | POA: Diagnosis not present

## 2018-07-16 DIAGNOSIS — E038 Other specified hypothyroidism: Secondary | ICD-10-CM | POA: Diagnosis not present

## 2018-07-16 DIAGNOSIS — I10 Essential (primary) hypertension: Secondary | ICD-10-CM | POA: Diagnosis not present

## 2018-07-16 DIAGNOSIS — I5022 Chronic systolic (congestive) heart failure: Secondary | ICD-10-CM | POA: Diagnosis not present

## 2018-08-15 DIAGNOSIS — I5022 Chronic systolic (congestive) heart failure: Secondary | ICD-10-CM | POA: Diagnosis not present

## 2018-08-15 DIAGNOSIS — E038 Other specified hypothyroidism: Secondary | ICD-10-CM | POA: Diagnosis not present

## 2018-08-15 DIAGNOSIS — I10 Essential (primary) hypertension: Secondary | ICD-10-CM | POA: Diagnosis not present

## 2018-09-07 IMAGING — CR DG HIP (WITH OR WITHOUT PELVIS) 2-3V*R*
3 series · 3 of 3 positions shown · non-contrast
Comparison: None.

CLINICAL DATA: Fell at home today.  RIGHT femur pain.

EXAM:
DG HIP (WITH OR WITHOUT PELVIS) 2-3V RIGHT; RIGHT FEMUR 2 VIEWS

[pelvis ap]
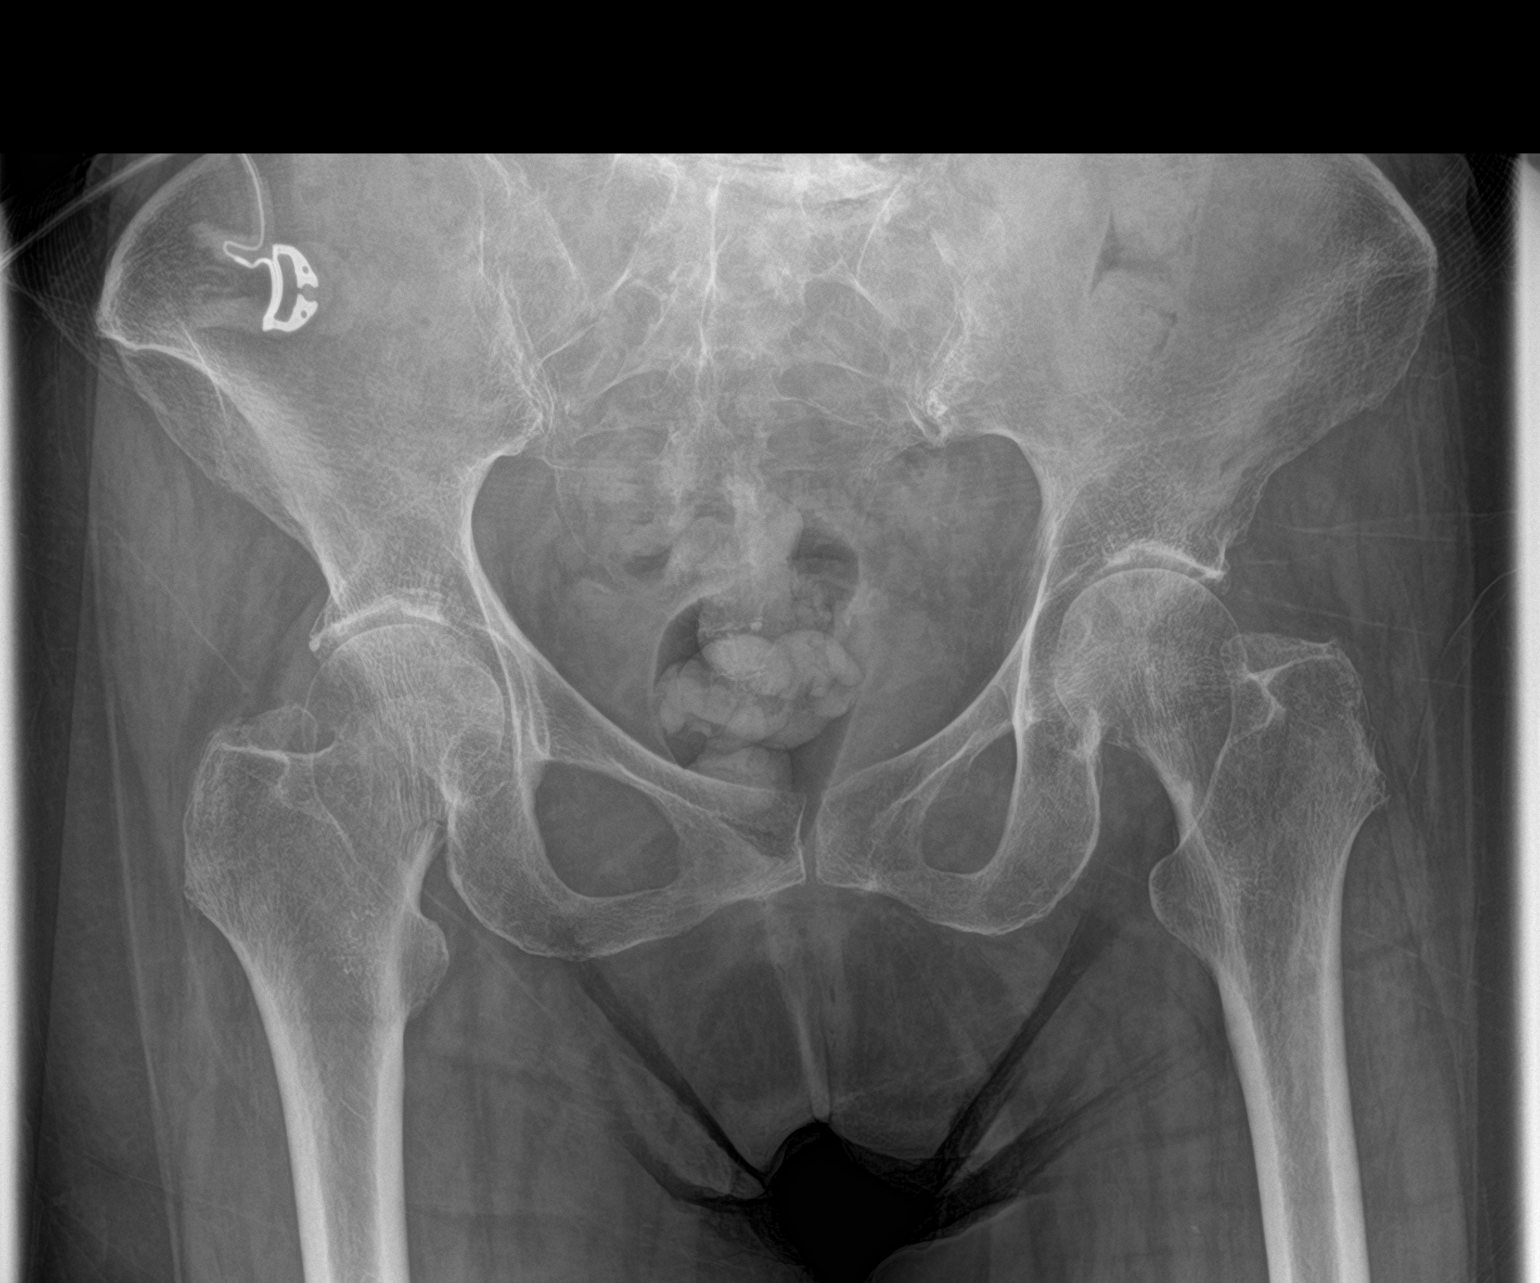

[hip ap]
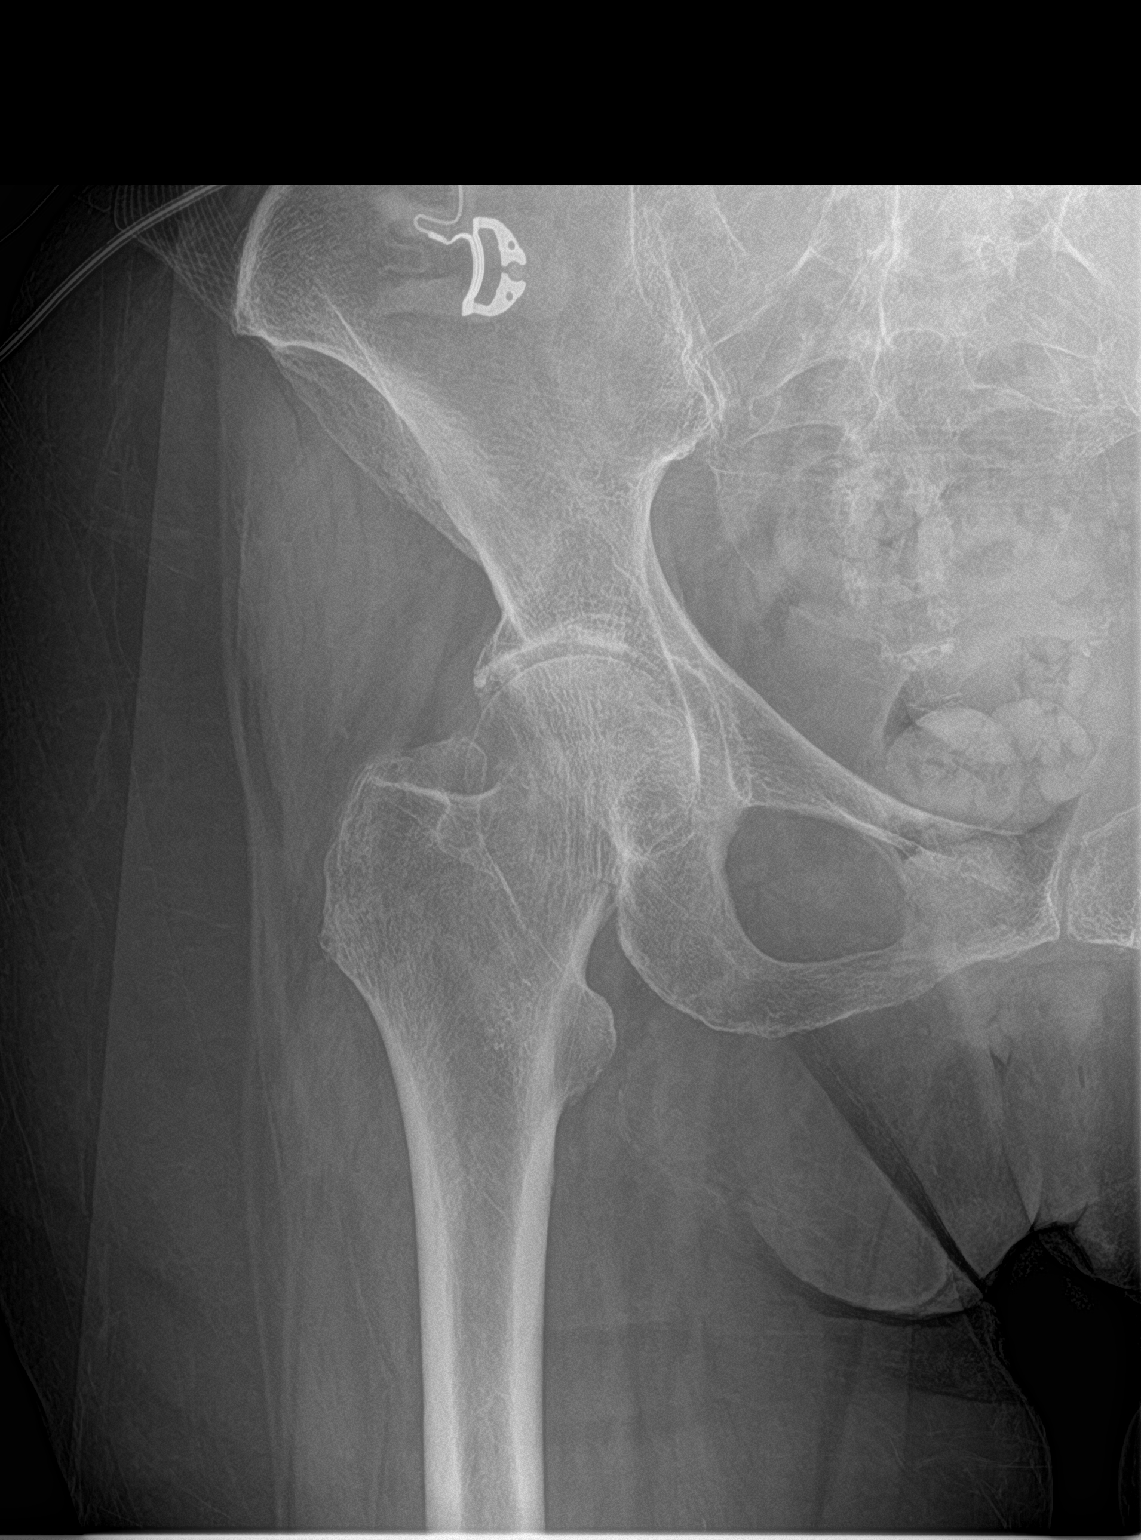

[hip lat]
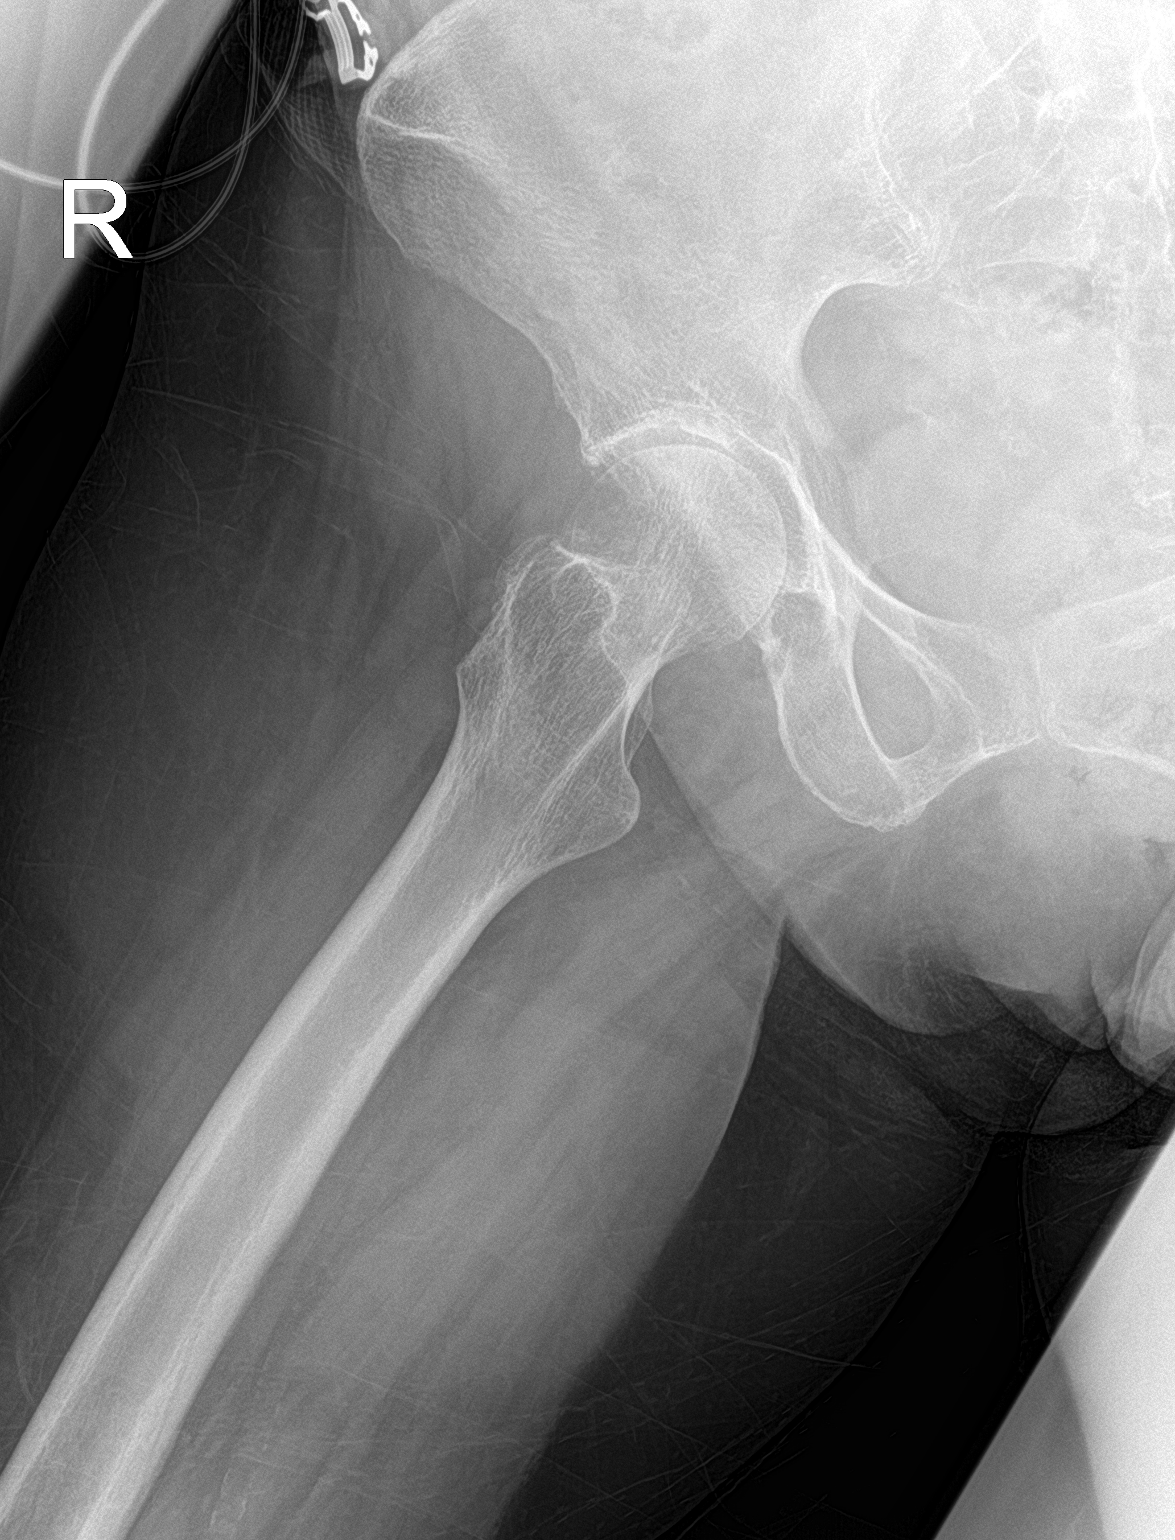

[3 of 3 positions shown; findings below may reference images not displayed]

FINDINGS: Acute impacted RIGHT femoral neck fracture in alignment. Femoral
heads are located. No dislocation. No destructive bony lesions.
Osteopenia. Soft tissue planes are nonsuspicious. Mild vascular
calcifications.
IMPRESSION: Acute nondisplaced RIGHT femoral neck fracture.  No dislocation.

## 2018-09-08 IMAGING — DX DG PORTABLE PELVIS
1 series · 1 of 1 positions shown · non-contrast
Comparison: Intraoperative images dated 10/22/2016

CLINICAL DATA: Right hip fracture.  Right total hip replacement.

EXAM:
PORTABLE PELVIS 1-2 VIEWS

[pelvis ap]
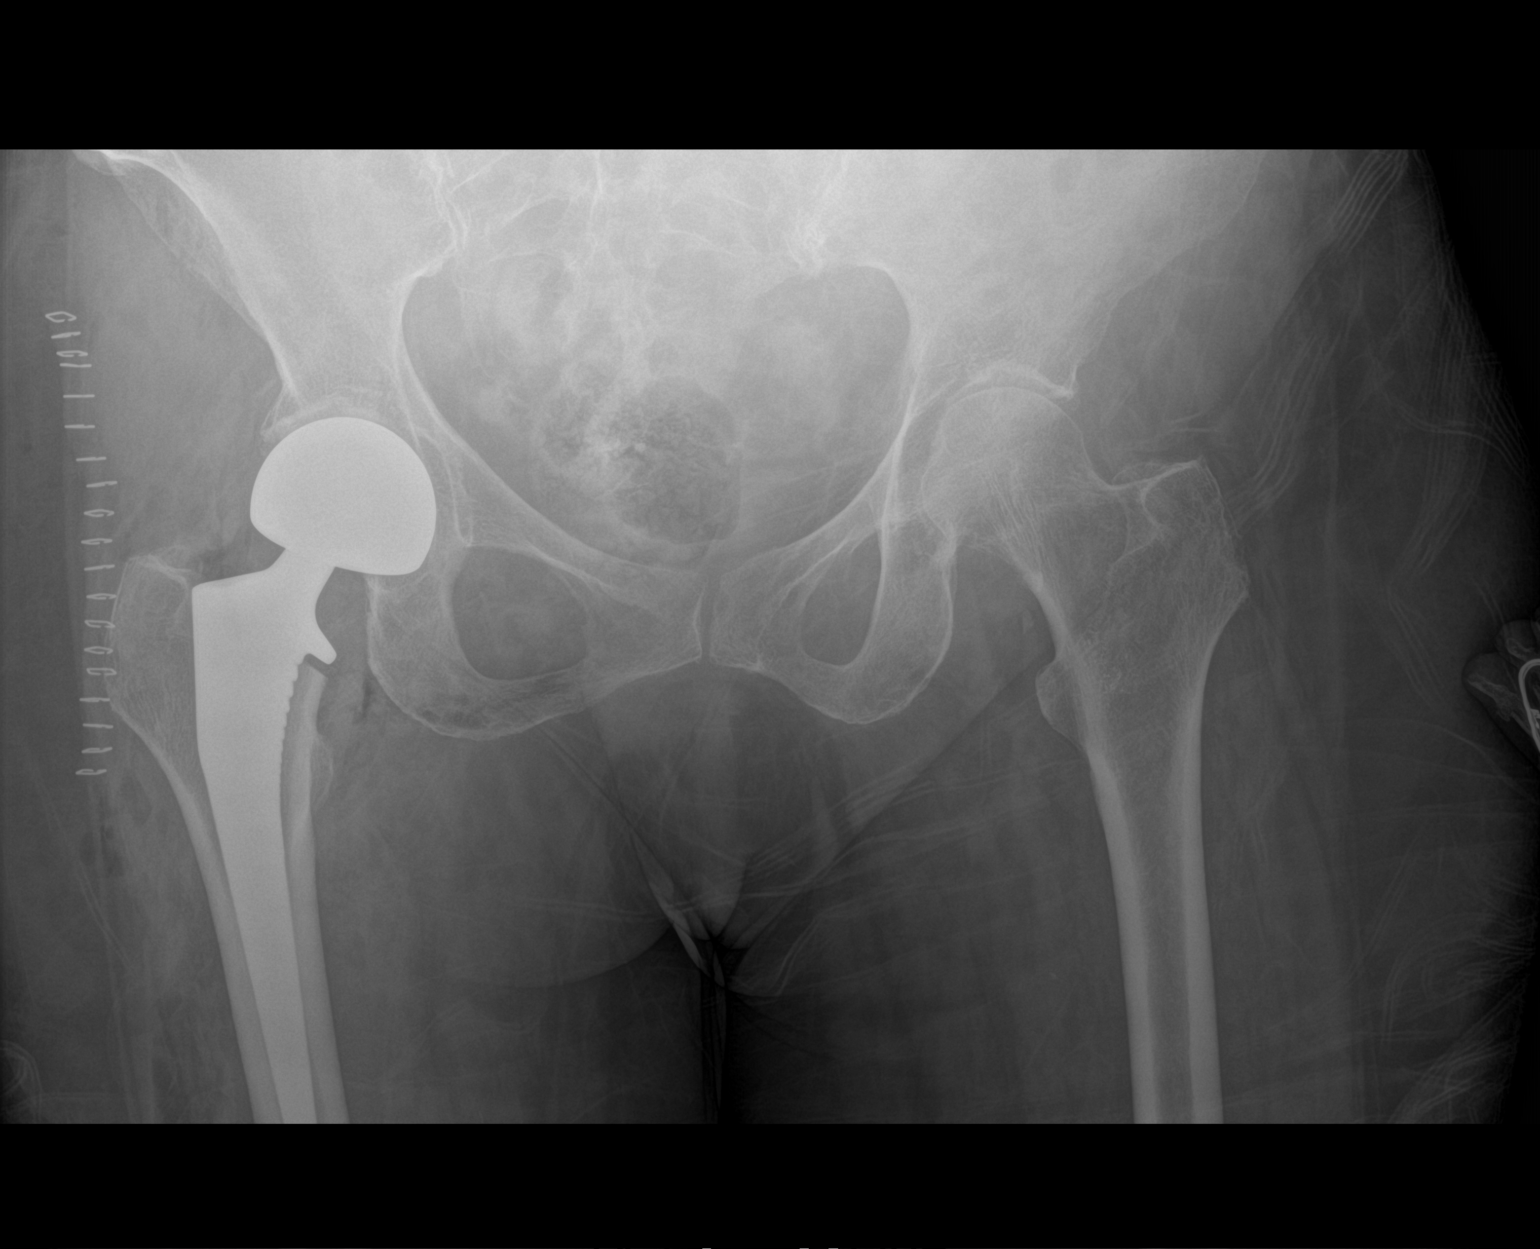

[1 of 1 positions shown; findings below may reference images not displayed]

FINDINGS: The femoral and acetabular components of the bipolar hip prosthesis
appear in good position in the AP projection. No fracture. Surgical
staple line noted.
IMPRESSION: Satisfactory appearance of the right hip after total hip
replacement.

## 2018-09-17 DIAGNOSIS — I5022 Chronic systolic (congestive) heart failure: Secondary | ICD-10-CM | POA: Diagnosis not present

## 2018-09-17 DIAGNOSIS — E038 Other specified hypothyroidism: Secondary | ICD-10-CM | POA: Diagnosis not present

## 2018-09-17 DIAGNOSIS — I10 Essential (primary) hypertension: Secondary | ICD-10-CM | POA: Diagnosis not present

## 2018-10-20 DIAGNOSIS — I10 Essential (primary) hypertension: Secondary | ICD-10-CM | POA: Diagnosis not present

## 2018-10-20 DIAGNOSIS — I5022 Chronic systolic (congestive) heart failure: Secondary | ICD-10-CM | POA: Diagnosis not present

## 2018-10-20 DIAGNOSIS — E038 Other specified hypothyroidism: Secondary | ICD-10-CM | POA: Diagnosis not present

## 2018-11-19 DIAGNOSIS — E038 Other specified hypothyroidism: Secondary | ICD-10-CM | POA: Diagnosis not present

## 2018-11-19 DIAGNOSIS — I5022 Chronic systolic (congestive) heart failure: Secondary | ICD-10-CM | POA: Diagnosis not present

## 2018-11-19 DIAGNOSIS — I10 Essential (primary) hypertension: Secondary | ICD-10-CM | POA: Diagnosis not present

## 2018-12-23 DIAGNOSIS — I10 Essential (primary) hypertension: Secondary | ICD-10-CM | POA: Diagnosis not present

## 2018-12-23 DIAGNOSIS — E038 Other specified hypothyroidism: Secondary | ICD-10-CM | POA: Diagnosis not present

## 2018-12-23 DIAGNOSIS — I5022 Chronic systolic (congestive) heart failure: Secondary | ICD-10-CM | POA: Diagnosis not present

## 2019-01-04 DIAGNOSIS — Z20828 Contact with and (suspected) exposure to other viral communicable diseases: Secondary | ICD-10-CM | POA: Diagnosis not present

## 2019-01-12 DIAGNOSIS — Z20828 Contact with and (suspected) exposure to other viral communicable diseases: Secondary | ICD-10-CM | POA: Diagnosis not present

## 2019-01-19 DIAGNOSIS — Z20828 Contact with and (suspected) exposure to other viral communicable diseases: Secondary | ICD-10-CM | POA: Diagnosis not present

## 2019-01-24 DIAGNOSIS — L570 Actinic keratosis: Secondary | ICD-10-CM | POA: Diagnosis not present

## 2019-01-26 DIAGNOSIS — Z20828 Contact with and (suspected) exposure to other viral communicable diseases: Secondary | ICD-10-CM | POA: Diagnosis not present

## 2019-02-01 DIAGNOSIS — Z20828 Contact with and (suspected) exposure to other viral communicable diseases: Secondary | ICD-10-CM | POA: Diagnosis not present

## 2019-02-03 DIAGNOSIS — Z20828 Contact with and (suspected) exposure to other viral communicable diseases: Secondary | ICD-10-CM | POA: Diagnosis not present

## 2019-02-08 DIAGNOSIS — Z23 Encounter for immunization: Secondary | ICD-10-CM | POA: Diagnosis not present

## 2019-02-08 DIAGNOSIS — Z20828 Contact with and (suspected) exposure to other viral communicable diseases: Secondary | ICD-10-CM | POA: Diagnosis not present

## 2019-02-15 DIAGNOSIS — Z20828 Contact with and (suspected) exposure to other viral communicable diseases: Secondary | ICD-10-CM | POA: Diagnosis not present

## 2019-02-22 DIAGNOSIS — Z20828 Contact with and (suspected) exposure to other viral communicable diseases: Secondary | ICD-10-CM | POA: Diagnosis not present

## 2019-02-26 DIAGNOSIS — I10 Essential (primary) hypertension: Secondary | ICD-10-CM | POA: Diagnosis not present

## 2019-02-26 DIAGNOSIS — E038 Other specified hypothyroidism: Secondary | ICD-10-CM | POA: Diagnosis not present

## 2019-02-26 DIAGNOSIS — I5032 Chronic diastolic (congestive) heart failure: Secondary | ICD-10-CM | POA: Diagnosis not present

## 2019-03-01 DIAGNOSIS — Z20828 Contact with and (suspected) exposure to other viral communicable diseases: Secondary | ICD-10-CM | POA: Diagnosis not present

## 2019-03-29 DIAGNOSIS — I5032 Chronic diastolic (congestive) heart failure: Secondary | ICD-10-CM | POA: Diagnosis not present

## 2019-03-29 DIAGNOSIS — E038 Other specified hypothyroidism: Secondary | ICD-10-CM | POA: Diagnosis not present

## 2019-03-29 DIAGNOSIS — I10 Essential (primary) hypertension: Secondary | ICD-10-CM | POA: Diagnosis not present

## 2019-05-10 DIAGNOSIS — U071 COVID-19: Secondary | ICD-10-CM | POA: Diagnosis not present

## 2019-05-12 DIAGNOSIS — Z23 Encounter for immunization: Secondary | ICD-10-CM | POA: Diagnosis not present

## 2019-05-17 DIAGNOSIS — U071 COVID-19: Secondary | ICD-10-CM | POA: Diagnosis not present

## 2019-05-24 DIAGNOSIS — U071 COVID-19: Secondary | ICD-10-CM | POA: Diagnosis not present

## 2019-05-29 DIAGNOSIS — I10 Essential (primary) hypertension: Secondary | ICD-10-CM | POA: Diagnosis not present

## 2019-05-29 DIAGNOSIS — E038 Other specified hypothyroidism: Secondary | ICD-10-CM | POA: Diagnosis not present

## 2019-05-29 DIAGNOSIS — I5032 Chronic diastolic (congestive) heart failure: Secondary | ICD-10-CM | POA: Diagnosis not present

## 2019-07-01 DIAGNOSIS — I10 Essential (primary) hypertension: Secondary | ICD-10-CM | POA: Diagnosis not present

## 2019-07-01 DIAGNOSIS — E038 Other specified hypothyroidism: Secondary | ICD-10-CM | POA: Diagnosis not present

## 2019-07-01 DIAGNOSIS — I5032 Chronic diastolic (congestive) heart failure: Secondary | ICD-10-CM | POA: Diagnosis not present

## 2019-07-31 DIAGNOSIS — I10 Essential (primary) hypertension: Secondary | ICD-10-CM | POA: Diagnosis not present

## 2019-07-31 DIAGNOSIS — I5032 Chronic diastolic (congestive) heart failure: Secondary | ICD-10-CM | POA: Diagnosis not present

## 2019-07-31 DIAGNOSIS — E038 Other specified hypothyroidism: Secondary | ICD-10-CM | POA: Diagnosis not present

## 2019-08-16 DIAGNOSIS — R262 Difficulty in walking, not elsewhere classified: Secondary | ICD-10-CM | POA: Diagnosis not present

## 2019-08-16 DIAGNOSIS — B351 Tinea unguium: Secondary | ICD-10-CM | POA: Diagnosis not present

## 2019-08-16 DIAGNOSIS — L603 Nail dystrophy: Secondary | ICD-10-CM | POA: Diagnosis not present

## 2019-08-16 DIAGNOSIS — L853 Xerosis cutis: Secondary | ICD-10-CM | POA: Diagnosis not present

## 2019-08-16 DIAGNOSIS — I739 Peripheral vascular disease, unspecified: Secondary | ICD-10-CM | POA: Diagnosis not present

## 2019-09-01 DIAGNOSIS — I10 Essential (primary) hypertension: Secondary | ICD-10-CM | POA: Diagnosis not present

## 2019-09-01 DIAGNOSIS — I5032 Chronic diastolic (congestive) heart failure: Secondary | ICD-10-CM | POA: Diagnosis not present

## 2019-09-01 DIAGNOSIS — E038 Other specified hypothyroidism: Secondary | ICD-10-CM | POA: Diagnosis not present

## 2019-09-15 DIAGNOSIS — G4700 Insomnia, unspecified: Secondary | ICD-10-CM | POA: Diagnosis not present

## 2019-09-15 DIAGNOSIS — F329 Major depressive disorder, single episode, unspecified: Secondary | ICD-10-CM | POA: Diagnosis not present

## 2019-09-15 DIAGNOSIS — F0280 Dementia in other diseases classified elsewhere without behavioral disturbance: Secondary | ICD-10-CM | POA: Diagnosis not present

## 2019-09-15 DIAGNOSIS — G309 Alzheimer's disease, unspecified: Secondary | ICD-10-CM | POA: Diagnosis not present

## 2019-09-15 DIAGNOSIS — F419 Anxiety disorder, unspecified: Secondary | ICD-10-CM | POA: Diagnosis not present

## 2019-10-09 DIAGNOSIS — I5032 Chronic diastolic (congestive) heart failure: Secondary | ICD-10-CM | POA: Diagnosis not present

## 2019-10-09 DIAGNOSIS — I10 Essential (primary) hypertension: Secondary | ICD-10-CM | POA: Diagnosis not present

## 2019-10-09 DIAGNOSIS — E038 Other specified hypothyroidism: Secondary | ICD-10-CM | POA: Diagnosis not present

## 2019-11-10 DIAGNOSIS — F419 Anxiety disorder, unspecified: Secondary | ICD-10-CM | POA: Diagnosis not present

## 2019-11-10 DIAGNOSIS — I5032 Chronic diastolic (congestive) heart failure: Secondary | ICD-10-CM | POA: Diagnosis not present

## 2019-11-10 DIAGNOSIS — I10 Essential (primary) hypertension: Secondary | ICD-10-CM | POA: Diagnosis not present

## 2019-11-10 DIAGNOSIS — E038 Other specified hypothyroidism: Secondary | ICD-10-CM | POA: Diagnosis not present

## 2019-11-10 DIAGNOSIS — F0280 Dementia in other diseases classified elsewhere without behavioral disturbance: Secondary | ICD-10-CM | POA: Diagnosis not present

## 2019-11-10 DIAGNOSIS — G309 Alzheimer's disease, unspecified: Secondary | ICD-10-CM | POA: Diagnosis not present

## 2019-11-10 DIAGNOSIS — G4700 Insomnia, unspecified: Secondary | ICD-10-CM | POA: Diagnosis not present

## 2019-11-10 DIAGNOSIS — F329 Major depressive disorder, single episode, unspecified: Secondary | ICD-10-CM | POA: Diagnosis not present

## 2019-11-16 DIAGNOSIS — G309 Alzheimer's disease, unspecified: Secondary | ICD-10-CM | POA: Diagnosis not present

## 2019-11-16 DIAGNOSIS — R1311 Dysphagia, oral phase: Secondary | ICD-10-CM | POA: Diagnosis not present

## 2019-11-24 DIAGNOSIS — F0390 Unspecified dementia without behavioral disturbance: Secondary | ICD-10-CM | POA: Diagnosis not present

## 2019-11-24 DIAGNOSIS — F419 Anxiety disorder, unspecified: Secondary | ICD-10-CM | POA: Diagnosis not present

## 2019-11-24 DIAGNOSIS — G4700 Insomnia, unspecified: Secondary | ICD-10-CM | POA: Diagnosis not present

## 2019-11-24 DIAGNOSIS — F329 Major depressive disorder, single episode, unspecified: Secondary | ICD-10-CM | POA: Diagnosis not present

## 2019-12-11 DIAGNOSIS — I5032 Chronic diastolic (congestive) heart failure: Secondary | ICD-10-CM | POA: Diagnosis not present

## 2019-12-11 DIAGNOSIS — E038 Other specified hypothyroidism: Secondary | ICD-10-CM | POA: Diagnosis not present

## 2019-12-11 DIAGNOSIS — I10 Essential (primary) hypertension: Secondary | ICD-10-CM | POA: Diagnosis not present

## 2019-12-19 DIAGNOSIS — E039 Hypothyroidism, unspecified: Secondary | ICD-10-CM | POA: Diagnosis not present

## 2019-12-19 DIAGNOSIS — E785 Hyperlipidemia, unspecified: Secondary | ICD-10-CM | POA: Diagnosis not present

## 2020-01-05 DIAGNOSIS — F413 Other mixed anxiety disorders: Secondary | ICD-10-CM | POA: Diagnosis not present

## 2020-01-05 DIAGNOSIS — G4700 Insomnia, unspecified: Secondary | ICD-10-CM | POA: Diagnosis not present

## 2020-01-05 DIAGNOSIS — F338 Other recurrent depressive disorders: Secondary | ICD-10-CM | POA: Diagnosis not present

## 2020-01-05 DIAGNOSIS — F0390 Unspecified dementia without behavioral disturbance: Secondary | ICD-10-CM | POA: Diagnosis not present

## 2020-01-12 DIAGNOSIS — E038 Other specified hypothyroidism: Secondary | ICD-10-CM | POA: Diagnosis not present

## 2020-01-12 DIAGNOSIS — I5032 Chronic diastolic (congestive) heart failure: Secondary | ICD-10-CM | POA: Diagnosis not present

## 2020-01-12 DIAGNOSIS — I10 Essential (primary) hypertension: Secondary | ICD-10-CM | POA: Diagnosis not present

## 2020-01-26 DIAGNOSIS — Z23 Encounter for immunization: Secondary | ICD-10-CM | POA: Diagnosis not present

## 2020-01-31 DIAGNOSIS — B351 Tinea unguium: Secondary | ICD-10-CM | POA: Diagnosis not present

## 2020-01-31 DIAGNOSIS — I739 Peripheral vascular disease, unspecified: Secondary | ICD-10-CM | POA: Diagnosis not present

## 2020-01-31 DIAGNOSIS — L603 Nail dystrophy: Secondary | ICD-10-CM | POA: Diagnosis not present

## 2020-02-02 DIAGNOSIS — L570 Actinic keratosis: Secondary | ICD-10-CM | POA: Diagnosis not present

## 2020-02-02 DIAGNOSIS — C4481 Basal cell carcinoma of overlapping sites of skin: Secondary | ICD-10-CM | POA: Diagnosis not present

## 2020-02-12 DIAGNOSIS — F0280 Dementia in other diseases classified elsewhere without behavioral disturbance: Secondary | ICD-10-CM | POA: Diagnosis not present

## 2020-02-12 DIAGNOSIS — E038 Other specified hypothyroidism: Secondary | ICD-10-CM | POA: Diagnosis not present

## 2020-02-12 DIAGNOSIS — I10 Essential (primary) hypertension: Secondary | ICD-10-CM | POA: Diagnosis not present

## 2020-02-16 DIAGNOSIS — F338 Other recurrent depressive disorders: Secondary | ICD-10-CM | POA: Diagnosis not present

## 2020-02-16 DIAGNOSIS — F413 Other mixed anxiety disorders: Secondary | ICD-10-CM | POA: Diagnosis not present

## 2020-02-16 DIAGNOSIS — F0390 Unspecified dementia without behavioral disturbance: Secondary | ICD-10-CM | POA: Diagnosis not present

## 2020-02-16 DIAGNOSIS — G4700 Insomnia, unspecified: Secondary | ICD-10-CM | POA: Diagnosis not present

## 2020-03-14 DIAGNOSIS — E038 Other specified hypothyroidism: Secondary | ICD-10-CM | POA: Diagnosis not present

## 2020-03-14 DIAGNOSIS — I10 Essential (primary) hypertension: Secondary | ICD-10-CM | POA: Diagnosis not present

## 2020-03-14 DIAGNOSIS — F0280 Dementia in other diseases classified elsewhere without behavioral disturbance: Secondary | ICD-10-CM | POA: Diagnosis not present

## 2020-03-20 DIAGNOSIS — Z23 Encounter for immunization: Secondary | ICD-10-CM | POA: Diagnosis not present

## 2020-04-16 DIAGNOSIS — F0390 Unspecified dementia without behavioral disturbance: Secondary | ICD-10-CM | POA: Diagnosis not present

## 2020-04-16 DIAGNOSIS — I10 Essential (primary) hypertension: Secondary | ICD-10-CM | POA: Diagnosis not present

## 2020-04-16 DIAGNOSIS — F0280 Dementia in other diseases classified elsewhere without behavioral disturbance: Secondary | ICD-10-CM | POA: Diagnosis not present

## 2020-04-16 DIAGNOSIS — G4700 Insomnia, unspecified: Secondary | ICD-10-CM | POA: Diagnosis not present

## 2020-04-16 DIAGNOSIS — F413 Other mixed anxiety disorders: Secondary | ICD-10-CM | POA: Diagnosis not present

## 2020-04-16 DIAGNOSIS — F338 Other recurrent depressive disorders: Secondary | ICD-10-CM | POA: Diagnosis not present

## 2020-04-16 DIAGNOSIS — E038 Other specified hypothyroidism: Secondary | ICD-10-CM | POA: Diagnosis not present

## 2021-02-02 DEATH — deceased
